# Patient Record
Sex: Male | Born: 1995
Health system: Southern US, Community
[De-identification: ages and names within clinical notes are randomized; demographics above are authoritative.]

## PROBLEM LIST (undated history)

## (undated) DIAGNOSIS — J45990 Exercise induced bronchospasm: Secondary | ICD-10-CM

## (undated) DIAGNOSIS — J302 Other seasonal allergic rhinitis: Secondary | ICD-10-CM

## (undated) DIAGNOSIS — T7840XA Allergy, unspecified, initial encounter: Secondary | ICD-10-CM

## (undated) HISTORY — PX: ADENOIDECTOMY: SHX5191

## (undated) HISTORY — DX: Allergy, unspecified, initial encounter: T78.40XA

## (undated) HISTORY — DX: Exercise induced bronchospasm: J45.990

## (undated) HISTORY — PX: MYRINGOTOMY: SUR874

---
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
## 2017-04-05 DIAGNOSIS — Z0000 Encounter for general adult medical examination without abnormal findings: Secondary | ICD-10-CM | POA: Diagnosis not present

## 2017-04-05 DIAGNOSIS — Z713 Dietary counseling and surveillance: Secondary | ICD-10-CM | POA: Diagnosis not present

## 2017-04-05 DIAGNOSIS — Z7189 Other specified counseling: Secondary | ICD-10-CM | POA: Diagnosis not present

## 2017-05-12 DIAGNOSIS — Z23 Encounter for immunization: Secondary | ICD-10-CM | POA: Diagnosis not present

## 2018-03-09 DIAGNOSIS — H7292 Unspecified perforation of tympanic membrane, left ear: Secondary | ICD-10-CM | POA: Diagnosis not present

## 2018-03-09 DIAGNOSIS — J302 Other seasonal allergic rhinitis: Secondary | ICD-10-CM | POA: Insufficient documentation

## 2018-03-09 DIAGNOSIS — H66002 Acute suppurative otitis media without spontaneous rupture of ear drum, left ear: Secondary | ICD-10-CM | POA: Diagnosis not present

## 2018-03-09 DIAGNOSIS — H6983 Other specified disorders of Eustachian tube, bilateral: Secondary | ICD-10-CM | POA: Diagnosis not present

## 2018-03-09 DIAGNOSIS — J069 Acute upper respiratory infection, unspecified: Secondary | ICD-10-CM | POA: Diagnosis not present

## 2018-04-07 DIAGNOSIS — Z0000 Encounter for general adult medical examination without abnormal findings: Secondary | ICD-10-CM | POA: Diagnosis not present

## 2018-04-07 DIAGNOSIS — Z713 Dietary counseling and surveillance: Secondary | ICD-10-CM | POA: Diagnosis not present

## 2018-04-07 DIAGNOSIS — Z68.41 Body mass index (BMI) pediatric, 5th percentile to less than 85th percentile for age: Secondary | ICD-10-CM | POA: Diagnosis not present

## 2018-04-29 DIAGNOSIS — Z0000 Encounter for general adult medical examination without abnormal findings: Secondary | ICD-10-CM | POA: Diagnosis not present

## 2018-04-29 DIAGNOSIS — H6983 Other specified disorders of Eustachian tube, bilateral: Secondary | ICD-10-CM | POA: Diagnosis not present

## 2018-04-29 DIAGNOSIS — H9012 Conductive hearing loss, unilateral, left ear, with unrestricted hearing on the contralateral side: Secondary | ICD-10-CM | POA: Diagnosis not present

## 2018-04-29 DIAGNOSIS — H7292 Unspecified perforation of tympanic membrane, left ear: Secondary | ICD-10-CM | POA: Diagnosis not present

## 2018-05-13 DIAGNOSIS — H6123 Impacted cerumen, bilateral: Secondary | ICD-10-CM | POA: Diagnosis not present

## 2018-05-13 DIAGNOSIS — H722X2 Other marginal perforations of tympanic membrane, left ear: Secondary | ICD-10-CM | POA: Diagnosis not present

## 2018-05-13 DIAGNOSIS — H6983 Other specified disorders of Eustachian tube, bilateral: Secondary | ICD-10-CM | POA: Diagnosis not present

## 2019-10-16 ENCOUNTER — Other Ambulatory Visit: Payer: Self-pay | Admitting: Otolaryngology

## 2019-10-16 NOTE — H&P (Signed)
HPI:   Christopher Pittman is a 24 y.o. male who presents as a new patient.  The patient presents in the company of his mother for new patient evaluation. He is a former patient of mine who underwent previous bilateral myringotomy and tube placement, last performed at age 67 with bilateral T tubes and adenoidectomy. Patient has been stable without significant otologic problems, his left tube extruded several years ago and concerns been raised regarding possible tympanic membrane perforation. Patient had minimal complaints until 4 days ago when he developed mild otalgia and otorrhea. Patient has severe seasonal allergies and is using Singulair and Zyrtec. He has chronic symptoms of eustachian tube dysfunction, sinus congestion and frequent headache.  PMH/Meds/All/SocHx/FamHx/ROS:   No past medical history on file.  No past surgical history on file.  No family history of bleeding disorders, wound healing problems or difficulty with anesthesia.   Social History   Social History  . Marital status: N/A  Spouse name: N/A  . Number of children: N/A  . Years of education: N/A   Occupational History  . Not on file.   Social History Main Topics  . Smoking status: Not on file  . Smokeless tobacco: Not on file  . Alcohol use Not on file  . Drug use: Unknown  . Sexual activity: Not on file   Other Topics Concern  . Not on file   Social History Narrative  . No narrative on file   Current Outpatient Prescriptions:  . ciprofloxacin-dexAMETHasone (CIPRODEX) 0.3-0.1 % otic suspension, Place 4 drops into the left ear nightly for 10 days., Disp: 7.5 mL, Rfl: 1 . fluticasone propionate (FLONASE) 50 mcg/actuation nasal spray, 2 sprays by Nasal route daily., Disp: 1 Inhaler, Rfl: 11  A complete ROS was performed with pertinent positives/negatives noted in the HPI. The remainder of the ROS are negative.   Physical Exam:   There were no vitals taken for this visit.  Constitutional:  Patient  appears well-nourished and well-developed. No acute distress.  Head/Face: Facial features are symmetric. Skull is normocephalic. Hair and scalp are normal. Normal temporal artery pulses. TMJ shows no joint deformity swelling or erythema.  Eyes: Pupils are equal, round and reactive to light. Conjunctiva and lids are normal. Normal extraocular mobility. Normal vision by patient report.  Ears:  Right T tympanostomy tube is in place and patent, no evidence of middle ear effusion or infection. Left ear shows posterior tympanic membrane perforation at the site of his prior tube. There is moderate erythema and some otorrhea. No evidence of cholesteatoma.  Nose/Sinus/Nasopharynx: Septum is normal. Normal nasal mucosa. Normal inferior turbinates. Normal middle and superior turbinates, sinus ostia patent without obstruction, mass or discharge. Nasopharynx patent.  Oral cavity/Oropharynx: Lips normal, teeth and gums normal with good dentition, normal oral vestibule. Normal floor of mouth, tongue and oral mucosa, no mucosal lesions, ulcer or mass, normal tongue mobility.  Hard and soft palate normal with normal mobility. One plus tonsils, no erythema or exudate. Base of tongue, retromolar trigone and oral pharynx normal. Normal sensation, mobility and gag.  Neck: No cervical lymphadenopathy, mass or swelling. Salivary glands normal to palpation without swelling, erythema or mass. Normal facial nerve function. Normal thyroid gland palpation.  Neurological: Alert and oriented to self, place and time. Normal reflexes and motor skills, balance and coordination.  Psychiatric: No unusual anxiety or evidence of depression. Appropriate affect.  Independent Review of Additional Tests or Records:  None  Procedures:  None  Impression & Plans:   The  patient presents for evaluation with a 4-day history of left otalgia. He has undergone previous bilateral knee ergotamine T-tube placement, last  performed almost 10 years ago. The left tympanostomy tube has extruded and the patient has a relatively large tympanic memory perforation with inflammation and findings consistent with acute infection. Patient prescribed Ciprodex drops in the left ear nightly x10 days. Avoid water exposure. He will continue with his allergy medications including saline, fluticasone, Singulair and Zyrtec. Follow-up in our office in 1 month for recheck with audiogram first.

## 2019-10-17 ENCOUNTER — Encounter (HOSPITAL_BASED_OUTPATIENT_CLINIC_OR_DEPARTMENT_OTHER): Payer: Self-pay | Admitting: Otolaryngology

## 2019-10-17 ENCOUNTER — Other Ambulatory Visit: Payer: Self-pay

## 2019-10-19 ENCOUNTER — Inpatient Hospital Stay: Admission: RE | Admit: 2019-10-19 | Payer: Self-pay | Source: Ambulatory Visit

## 2019-10-19 ENCOUNTER — Other Ambulatory Visit (HOSPITAL_COMMUNITY): Payer: Self-pay

## 2019-10-19 DIAGNOSIS — Z713 Dietary counseling and surveillance: Secondary | ICD-10-CM | POA: Diagnosis not present

## 2019-10-19 DIAGNOSIS — Z6822 Body mass index (BMI) 22.0-22.9, adult: Secondary | ICD-10-CM | POA: Diagnosis not present

## 2019-10-19 DIAGNOSIS — Z23 Encounter for immunization: Secondary | ICD-10-CM | POA: Diagnosis not present

## 2019-10-19 DIAGNOSIS — Z0000 Encounter for general adult medical examination without abnormal findings: Secondary | ICD-10-CM | POA: Diagnosis not present

## 2019-10-23 ENCOUNTER — Ambulatory Visit (HOSPITAL_BASED_OUTPATIENT_CLINIC_OR_DEPARTMENT_OTHER): Admission: RE | Admit: 2019-10-23 | Payer: 59 | Source: Home / Self Care | Admitting: Otolaryngology

## 2019-10-23 HISTORY — DX: Other seasonal allergic rhinitis: J30.2

## 2019-10-23 SURGERY — TYMPANOPLASTY
Anesthesia: General | Laterality: Left

## 2019-12-11 ENCOUNTER — Emergency Department (HOSPITAL_BASED_OUTPATIENT_CLINIC_OR_DEPARTMENT_OTHER)
Admission: EM | Admit: 2019-12-11 | Discharge: 2019-12-11 | Disposition: A | Discharge status: Home / Self Care | Payer: No Typology Code available for payment source | Attending: Emergency Medicine | Admitting: Emergency Medicine

## 2019-12-11 ENCOUNTER — Encounter (HOSPITAL_BASED_OUTPATIENT_CLINIC_OR_DEPARTMENT_OTHER): Payer: Self-pay

## 2019-12-11 ENCOUNTER — Other Ambulatory Visit: Payer: Self-pay

## 2019-12-11 DIAGNOSIS — W208XXA Other cause of strike by thrown, projected or falling object, initial encounter: Secondary | ICD-10-CM | POA: Insufficient documentation

## 2019-12-11 DIAGNOSIS — Y929 Unspecified place or not applicable: Secondary | ICD-10-CM | POA: Diagnosis not present

## 2019-12-11 DIAGNOSIS — T25612A Corrosion of second degree of left ankle, initial encounter: Secondary | ICD-10-CM | POA: Diagnosis not present

## 2019-12-11 DIAGNOSIS — T6591XA Toxic effect of unspecified substance, accidental (unintentional), initial encounter: Secondary | ICD-10-CM | POA: Diagnosis not present

## 2019-12-11 DIAGNOSIS — T25212A Burn of second degree of left ankle, initial encounter: Secondary | ICD-10-CM | POA: Diagnosis not present

## 2019-12-11 DIAGNOSIS — T24632A Corrosion of second degree of left lower leg, initial encounter: Secondary | ICD-10-CM

## 2019-12-11 DIAGNOSIS — Y990 Civilian activity done for income or pay: Secondary | ICD-10-CM | POA: Diagnosis not present

## 2019-12-11 DIAGNOSIS — Y9389 Activity, other specified: Secondary | ICD-10-CM | POA: Diagnosis not present

## 2019-12-11 NOTE — ED Notes (Signed)
EDP at bedside irrigating wound.

## 2019-12-11 NOTE — ED Triage Notes (Signed)
Pt arrived ambulatory to ED with c/o chemical burn to left inner ankle today. 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 States he continued to work and noticed it was burning when he got off, unknown substance. Area is blistered. Pt also has a bruise on inner foot near heal, unsure of how long it has been there.

## 2019-12-11 NOTE — ED Provider Notes (Signed)
[facility] EM[facility] DEPART[facility] Provider Note   CSN: [id number] Arrival date & time: 12/11/19  0900     History Chief Complaint  Patient presents with  . Foot Burn    Christopher Pittman is a 24 y.o. male otherwise healthy no daily medication use presents today for burn of his left posterior medial ankle.  He reports that 4-5 hours ago while at work he dropped a box onto his pants and feels that a unknown chemical had spilled out of the box.  He reports he did not have any immediate pain however 20 is 30 minutes prior to leaving work less than an hour ago he felt a slight burning sensation to the back of his leg looked at the area and saw the wound.  He describes a mild burning sensation constant worsened with palpation improved with rest nonradiating.  He spoke to his managers and they do not know what possible chemical this could have been.  He denies any fever/chills, numbness/tingling, weakness, pain with motion of the knee, ankle or foot or any additional concerns or injuries.  Of note triage note mentions a bruise to the inner foot near the heel, patient feels this is some dirt left over from his shoe today. Area nontender.  I took a Psychologist, sport and exercise with soap and water and washed the dirt away.  This does not appear to be a bruise/injury.  Of note patient reports Tdap was updated last month.   HPI     Past Medical History:  Diagnosis Date  . Seasonal allergies     There are no problems to display for this patient.   Past Surgical History:  Procedure Laterality Date  . ADENOIDECTOMY    . MYRINGOTOMY         History reviewed. No pertinent family history.  Social History   Tobacco Use  . Smoking status: Never Smoker  . Smokeless tobacco: Never Used  Substance Use Topics  . Alcohol use: Never  . Drug use: Never    Home Medications Prior to Admission medications   Medication Sig Start Date End Date Taking? Authorizing Provider  fluticasone (FLONASE) 50 MCG/ACT  nasal spray USE 1 SPRAY EACH NOSTRIL DAILY. 07/24/11   Marcha Solders, MD    Allergies    Patient has no known allergies.  Review of Systems   Review of Systems  Constitutional: Negative.  Negative for chills and fever.  Musculoskeletal: Negative for arthralgias, joint swelling and myalgias.  Skin: Positive for wound.  Neurological: Negative.  Negative for weakness and numbness.    Physical Exam Updated Vital Signs BP 121/85 (BP Location: Right Arm)   Pulse 70   Temp 99.1 F (37.3 C) (Oral)   Resp 18   Ht 5\' 8"  (1.727 m)   Wt 70.3 kg   SpO2 100%   BMI 23.57 kg/m   Physical Exam Constitutional:      General: He is not in acute distress.    Appearance: Normal appearance. He is well-developed. He is not ill-appearing or diaphoretic.  HENT:     Head: Normocephalic and atraumatic.     Right Ear: External ear normal.     Left Ear: External ear normal.     Nose: Nose normal.  Eyes:     General: Vision grossly intact. Gaze aligned appropriately.     Pupils: Pupils are equal, round, and reactive to light.  Neck:     Trachea: Trachea and phonation normal. No tracheal deviation.  Pulmonary:  Effort: Pulmonary effort is normal. No respiratory distress.  Abdominal:     General: There is no distension.     Palpations: Abdomen is soft.     Tenderness: There is no abdominal tenderness. There is no guarding or rebound.  Musculoskeletal:        General: Normal range of motion.     Cervical back: Normal range of motion.     Left knee: Normal.     Right lower leg: Normal.     Left lower leg: Laceration (Wound) present. No swelling or deformity. No edema.     Right ankle: Normal.     Right Achilles Tendon: Normal.     Left ankle: Normal.     Left Achilles Tendon: Normal.     Right foot: Normal.     Left foot: Normal.  Skin:    General: Skin is warm and dry.          Comments: Superficial abrasion with crusting and small blisters present to the left posterior medial  ankle as pictured below.  Neurological:     Mental Status: He is alert.     GCS: GCS eye subscore is 4. GCS verbal subscore is 5. GCS motor subscore is 6.     Comments: Speech is clear and goal oriented, follows commands Major Cranial nerves without deficit, no facial droop Normal strength in lower extremities bilaterally including dorsiflexion and plantar flexion Sensation normal to light and sharp touch Moves extremities without ataxia, coordination intact  Psychiatric:        Behavior: Behavior normal.       ED Results / Procedures / Treatments   Labs (all labs ordered are listed, but only abnormal results are displayed) Labs Reviewed - No data to display  EKG None  Radiology No results found.  Procedures .Burn Treatment  Date/Time: 12/11/2019 10:27 AM Performed by: Bill Salinas, PA-C Authorized by: Bill Salinas, PA-C   Consent:    Consent obtained:  Verbal   Consent given by:  Patient   Risks discussed:  Bleeding and pain Procedure details:    Total body burn percentage - superficial :  1   Total body burn percentage - partial/full:  1 Burn area 1 details:    Burn depth:  Partial thickness (2nd)   Affected area:  Lower extremity   Lower extremity location:  L leg   Debridement performed: yes     Debridement mechanism:  Gauze   Indications for debridement: adherent debris, devitalized skin and ruptured blisters     Wound base:  Pink   Wound treatment:  Petroleum jelly, antiseptic skin cleanser and saline wash   Dressing:  Xeroform gauze Post-procedure details:    Patient tolerance of procedure:  Tolerated well, no immediate complications   (including critical care time)  Medications Ordered in ED Medications - No data to display  ED Course  I have reviewed the triage vital signs and the nursing notes.  Pertinent labs & imaging results that were available during my care of the patient were reviewed by me and considered in my medical decision  making (see chart for details).  Clinical Course as of Dec 10 1041  Mon Dec 11, 2019  0958 Clean, Xeroform   [BM]    Clinical Course User Index [BM] Elizabeth Palau   MDM Rules/Calculators/A&P                     24 year old male presents today for a  chemical burn of the left lower leg that occurred this morning.  He spilled a small small amount of chemical on his leg that spilled out of a box at work.  He went several hours working with the wet pants/sock before he noticed a burning sensation to his leg and noticed the wound.  He denies any other injuries today.  The questionable bruise on his left midfoot appears to be dirt that was wiped off.  Tdap up-to-date per patient.  I personally cleaned burn as above, unfortunately the chemical is unknown at this point.  Xeroform dressing was applied.  Patient tolerated procedure well.  Patient was given small amount of supplies for continued wound care at home.  No indication for antibiotics at this point.  He will follow-up with his primary care provider for wound recheck and return to the ER if he develops signs of infection or any worsening/new symptoms.  At this time there does not appear to be any evidence of an acute emergency medical condition and the patient appears stable for discharge with appropriate outpatient follow up. Diagnosis was discussed with patient who verbalizes understanding of care plan and is agreeable to discharge. I have discussed return precautions with patient who verbalizes understanding of return precautions. Patient encouraged to follow-up with their PCP. All questions answered.  Patient was seen and evaluated by Dr. Pilar Plate during this visit who agrees with management and discharge.  Note: Portions of this report may have been transcribed using voice recognition software. Every effort was made to ensure accuracy; however, inadvertent computerized transcription errors may still be present. Final Clinical  Impression(s) / ED Diagnoses Final diagnoses:  Partial thickness chemical burn of left lower leg, initial encounter    Rx / DC Orders ED Discharge Orders    None       Tyrees, Chopin 12/11/19 1043    Sabas Sous, MD 12/12/19 [phone]

## 2019-12-11 NOTE — Discharge Instructions (Addendum)
You have been diagnosed today with Chemical Burn of Left Lower Leg.  At this time there does not appear to be the presence of an emergent medical condition, however there is always the potential for conditions to change. Please read and follow the below instructions.  Please return to the Emergency Department immediately for any new or worsening symptoms. Please be sure to follow up with your Primary Care Provider this week for recheck of your wound. Please continue wound care at home.  You may rinse the area gently with clean water, keep bandaged with sterile gauze and the petroleum gauze given to you today.  If you develop any signs of infection return to the ER immediately.  Get help right away if: You develop any signs of infection, such as: Red streaks near the burn. Fluid, blood, or pus coming from the burn. A bad smell coming from your burn. You have very bad swelling. You have very bad pain. You have a fever. You lose feeling (have numbness) or have tingling in the burned area or farther down your legs or arms. You have trouble breathing, or you are coughing or breathing loudly (wheezing). You have chest pain. You have any new/concerning or worsening of symptoms  Please read the additional information packets attached to your discharge summary.  Do not take your medicine if  develop an itchy rash, swelling in your mouth or lips, or difficulty breathing; call 911 and seek immediate emergency medical attention if this occurs.  Note: Portions of this text may have been transcribed using voice recognition software. Every effort was made to ensure accuracy; however, inadvertent computerized transcription errors may still be present.

## 2019-12-14 ENCOUNTER — Encounter (HOSPITAL_BASED_OUTPATIENT_CLINIC_OR_DEPARTMENT_OTHER): Payer: Self-pay | Admitting: *Deleted

## 2019-12-14 ENCOUNTER — Emergency Department (HOSPITAL_BASED_OUTPATIENT_CLINIC_OR_DEPARTMENT_OTHER)
Admission: EM | Admit: 2019-12-14 | Discharge: 2019-12-14 | Disposition: A | Discharge status: Home / Self Care | Payer: No Typology Code available for payment source | Attending: Emergency Medicine | Admitting: Emergency Medicine

## 2019-12-14 ENCOUNTER — Other Ambulatory Visit: Payer: Self-pay

## 2019-12-14 DIAGNOSIS — Y9289 Other specified places as the place of occurrence of the external cause: Secondary | ICD-10-CM | POA: Diagnosis not present

## 2019-12-14 DIAGNOSIS — X58XXXD Exposure to other specified factors, subsequent encounter: Secondary | ICD-10-CM | POA: Diagnosis not present

## 2019-12-14 DIAGNOSIS — Z5189 Encounter for other specified aftercare: Secondary | ICD-10-CM

## 2019-12-14 DIAGNOSIS — T550X1D Toxic effect of soaps, accidental (unintentional), subsequent encounter: Secondary | ICD-10-CM | POA: Diagnosis not present

## 2019-12-14 DIAGNOSIS — Z79899 Other long term (current) drug therapy: Secondary | ICD-10-CM | POA: Insufficient documentation

## 2019-12-14 DIAGNOSIS — T24432D Corrosion of unspecified degree of left lower leg, subsequent encounter: Secondary | ICD-10-CM | POA: Diagnosis not present

## 2019-12-14 DIAGNOSIS — Z4800 Encounter for change or removal of nonsurgical wound dressing: Secondary | ICD-10-CM | POA: Diagnosis present

## 2019-12-14 MED ORDER — DOXYCYCLINE HYCLATE 100 MG PO CAPS: 100.0000 mg | ORAL_CAPSULE | Freq: Two times a day (BID) | ORAL | 0 refills | Status: DC

## 2019-12-14 MED ORDER — BACITRACIN ZINC 500 UNIT/GM EX OINT
TOPICAL_OINTMENT | Freq: Two times a day (BID) | CUTANEOUS | Status: DC
  Filled 2019-12-14: qty 28.35

## 2019-12-14 MED ORDER — KETOROLAC TROMETHAMINE 15 MG/ML IJ SOLN
15.0000 mg | Freq: Once | INTRAMUSCULAR | Status: AC
  Administered 2019-12-14: 15 mg via INTRAMUSCULAR
  Filled 2019-12-14: qty 1

## 2019-12-14 MED FILL — DOXYCYCLINE HYCLATE 100 MG: 100 | 7 days supply | Qty: 14 | Fill #0

## 2019-12-14 NOTE — ED Notes (Signed)
Returns for wound recheck at Left ankle area, states rec injury from spilled chemical for cleaning. States he has kept dressing on as instructed and dry. States has had some drainage from wound. Appears light brown in color, states Monday and Tuesday drainage was noted from dressing into sock and pants. Noted to have some issues placing weight on injured ankle

## 2019-12-14 NOTE — ED Triage Notes (Signed)
Wound check left ankle. Chemical burn 3 days ago.

## 2019-12-14 NOTE — ED Provider Notes (Signed)
[facility] DEPART[facility] Provider Note   CSN: [id number] Arrival date & time: 12/14/19  1109     History Chief Complaint  Patient presents with  . Wound Check    Christopher Pittman is a 24 y.o. male.  HPI 24 year old African-American male with no pertinent past medical history presents to the emergency department today for evaluation of wound recheck.  Patient reports that on 12/11/2019 he sustained a chemical burn to his left lower leg.  This was while he was at work.  Patient is unsure of the chemical.  He states that it was some sort of cleaning agent.  Denies feeling like Clorox.  Patient states that he was seen in the ER that day.  For some burning sensation to the area.  Have the area debrided.  Patient has been trying to keep it clean at home.  He is wearing his socks.  Patient reports that he is had some drainage from the area.  This is brown in nature.  Patient denies any fevers or chills.  He does report some pain with ambulation on the foot.  Patient denies any swelling.  No nausea or vomiting.  No history of MRSA.  Tetanus was up-to-date.  Patient was initially not placed on antibiotics.    Past Medical History:  Diagnosis Date  . Seasonal allergies     There are no problems to display for this patient.   Past Surgical History:  Procedure Laterality Date  . ADENOIDECTOMY    . MYRINGOTOMY         No family history on file.  Social History   Tobacco Use  . Smoking status: Never Smoker  . Smokeless tobacco: Never Used  Substance Use Topics  . Alcohol use: Never  . Drug use: Never    Home Medications Prior to Admission medications   Medication Sig Start Date End Date Taking? Authorizing Provider  fluticasone (FLONASE) 50 MCG/ACT nasal spray USE 1 SPRAY EACH NOSTRIL DAILY. 07/24/11  Yes Ramgoolam, Donnie Aho, MD  doxycycline (VIBRAMYCIN) 100 MG capsule Take 1 capsule (100 mg total) by mouth 2 (two) times daily. 12/14/19   Doristine Devoid, PA-C     Allergies    Patient has no known allergies.  Review of Systems   Review of Systems Constitutional: Negative.  Negative for chills and fever.  Musculoskeletal: Negative for arthralgias, joint swelling and myalgias.  Skin: Positive for wound.  Neurological: Negative.  Negative for weakness and numbness.   Physical Exam Updated Vital Signs BP 111/74   Pulse 73   Temp 98.3 F (36.8 C) (Oral)   Resp 20   Ht 5\' 8"  (1.727 m)   Wt 70.3 kg   SpO2 100%   BMI 23.57 kg/m   Physical Exam Vitals and nursing note reviewed.  Constitutional:      General: He is not in acute distress.    Appearance: He is well-developed.  HENT:     Head: Normocephalic and atraumatic.  Eyes:     General: No scleral icterus.       Right eye: No discharge.        Left eye: No discharge.  Cardiovascular:     Pulses: Normal pulses.  Pulmonary:     Effort: No respiratory distress.  Musculoskeletal:        General: Normal range of motion.     Cervical back: Normal range of motion.     Comments: Full range of motion to left ankle.  DP pulse are  2+.  Sensation intact.  Skin:    General: Skin is warm and dry.     Capillary Refill: Capillary refill takes less than 2 seconds.     Coloration: Skin is not pale.     Comments: Patient with healing wound to the left posterior lower leg.  There is minimal erythema.  Wound is not warm to touch.  There is no fluctuance.  No appreciable drainage to the area.  Does like some granulation tissue with ulceration appreciated.  Skin compartments are soft.  No induration or fluctuance appreciated.  Neurological:     Mental Status: He is alert.     Comments: Strength and sensation intact.  Ambulates with normal gait.  Psychiatric:        Behavior: Behavior normal.        Thought Content: Thought content normal.        Judgment: Judgment normal.     ED Results / Procedures / Treatments   Labs (all labs ordered are listed, but only abnormal results are displayed) Labs  Reviewed - No data to display  EKG None  Radiology No results found.  Procedures Procedures (including critical care time)  Medications Ordered in ED Medications  ketorolac (TORADOL) 15 MG/ML injection 15 mg (has no administration in time range)  bacitracin ointment (has no administration in time range)    ED Course  I have reviewed the triage vital signs and the nursing notes.  Pertinent labs & imaging results that were available during my care of the patient were reviewed by me and considered in my medical decision making (see chart for details).   Initially not started on antibiotics.24 year old male presents the ER for wound recheck.  Patient had chemical burn 3 days ago. Reports some drainage to the area with increased pain with ambulation.  No systemic signs of infection.  Wound on the examination does appear to be healing. Granulation tissue noted. FROM of left ankle. Achilles tendon in tact. Doubt septic joint. Minimal erythema. Given drainage however will place on doxy. Dicussed wound care. Pt has taken no medication for pain. Encouraged NSAIDS and tylenol. Pt will need wound recheck in 2 days.   Pt is hemodynamically stable, in NAD, & able to ambulate in the ED. Evaluation does not show pathology that would require ongoing emergent intervention or inpatient treatment. I explained the diagnosis to the patient. Pain has been managed & has no complaints prior to dc. Pt is comfortable with above plan and is stable for discharge at this time. All questions were answered prior to disposition. Strict return precautions for f/u to the ED were discussed. Encouraged follow up with PCP.     MDM Rules/Calculators/A&P                       Final Clinical Impression(s) / ED Diagnoses Final diagnoses:  Visit for wound check    Rx / DC Orders ED Discharge Orders         Ordered    doxycycline (VIBRAMYCIN) 100 MG capsule  2 times daily     12/14/19 1151           Wallace Keller 12/14/19 1158    Little, Ambrose Finland, MD 12/14/19 1253

## 2019-12-14 NOTE — Discharge Instructions (Addendum)
You have been seen today in the Emergency Department for cellulitis, a superficial skin infection. Please take your antibiotics as prescribed for their ENTIRE prescribed duration.   May alternate between motrin and tylenol for fever, pain, and swelling.  Please follow up with your doctor or return to the ER in 2 days for recheck of your infection if you are not improving.  Call your doctor sooner or return to the ER if you develop worsening signs of infection such as: increased redness, increased pain, pus, fever, or other symptoms that concern you.  

## 2020-02-09 DIAGNOSIS — J309 Allergic rhinitis, unspecified: Secondary | ICD-10-CM | POA: Diagnosis not present

## 2020-02-09 DIAGNOSIS — H7392 Unspecified disorder of tympanic membrane, left ear: Secondary | ICD-10-CM | POA: Diagnosis not present

## 2020-02-09 DIAGNOSIS — B9729 Other coronavirus as the cause of diseases classified elsewhere: Secondary | ICD-10-CM | POA: Diagnosis not present

## 2020-02-09 DIAGNOSIS — J029 Acute pharyngitis, unspecified: Secondary | ICD-10-CM | POA: Diagnosis not present

## 2020-03-26 DIAGNOSIS — H7292 Unspecified perforation of tympanic membrane, left ear: Secondary | ICD-10-CM

## 2020-03-26 DIAGNOSIS — H9012 Conductive hearing loss, unilateral, left ear, with unrestricted hearing on the contralateral side: Secondary | ICD-10-CM | POA: Diagnosis not present

## 2020-03-26 HISTORY — DX: Unspecified perforation of tympanic membrane, left ear: H72.92

## 2020-04-01 ENCOUNTER — Encounter (HOSPITAL_BASED_OUTPATIENT_CLINIC_OR_DEPARTMENT_OTHER): Payer: Self-pay | Admitting: Otolaryngology

## 2020-04-01 ENCOUNTER — Other Ambulatory Visit: Payer: Self-pay

## 2020-04-04 ENCOUNTER — Other Ambulatory Visit (HOSPITAL_COMMUNITY): Payer: BC Managed Care – PPO

## 2020-04-05 ENCOUNTER — Other Ambulatory Visit (HOSPITAL_COMMUNITY)
Admission: RE | Admit: 2020-04-05 | Discharge: 2020-04-05 | Disposition: A | Discharge status: Home / Self Care | Payer: BC Managed Care – PPO | Source: Ambulatory Visit | Attending: Otolaryngology | Admitting: Otolaryngology

## 2020-04-05 DIAGNOSIS — Z20822 Contact with and (suspected) exposure to covid-19: Secondary | ICD-10-CM | POA: Insufficient documentation

## 2020-04-05 DIAGNOSIS — Z01812 Encounter for preprocedural laboratory examination: Secondary | ICD-10-CM | POA: Insufficient documentation

## 2020-04-05 LAB — SARS CORONAVIRUS 2 (TAT 6-24 HRS): SARS Coronavirus 2: NEGATIVE

## 2020-04-06 NOTE — H&P (Signed)
  HPI:   Christopher Pittman is a 25 y.o. male who presents as a return Patient.   Current problem: Ears.  HPI: Return visit. He never did follow-up for tympanoplasty last year. He was told that his right ear may not have a perforation anymore. He feels that his hearing is better on the right. No other new complaints.  PMH/Meds/All/SocHx/FamHx/ROS:   Past Medical History:  Diagnosis Date  . Allergy   Past Surgical History:  Procedure Laterality Date  . ADENOIDECTOMY  . MYRINGOTOMY W/ TUBES  . TYMPANOSTOMY TUBE PLACEMENT   No family history of bleeding disorders, wound healing problems or difficulty with anesthesia.   Social History   Socioeconomic History  . Marital status: Single  Spouse name: Not on file  . Number of children: Not on file  . Years of education: Not on file  . Highest education level: Not on file  Occupational History  . Not on file  Tobacco Use  . Smoking status: Never Smoker  . Smokeless tobacco: Never Used  Substance and Sexual Activity  . Alcohol use: No  . Drug use: Not on file  . Sexual activity: Not on file  Other Topics Concern  . Not on file  Social History Narrative  . Not on file   Social Determinants of Health   Financial Resource Strain:  . Difficulty of Paying Living Expenses:  Food Insecurity:  . Worried About Programme researcher, broadcasting/film/video in the Last Year:  . Barista in the Last Year:  Transportation Needs:  . Freight forwarder (Medical):  Marland Kitchen Lack of Transportation (Non-Medical):  Physical Activity:  . Days of Exercise per Week:  . Minutes of Exercise per Session:  Stress:  . Feeling of Stress :  Social Connections:  . Frequency of Communication with Friends and Family:  . Frequency of Social Gatherings with Friends and Family:  . Attends Religious Services:  . Active Member of Clubs or Organizations:  . Attends Banker Meetings:  Marland Kitchen Marital Status:   Current Outpatient Medications:  . cetirizine HCl (ZYRTEC  ORAL), Take by mouth., Disp: , Rfl:  . montelukast (SINGULAIR) 10 mg tablet, TK 1 T PO QD, Disp: , Rfl: 0 . fluticasone propionate (FLONASE) 50 mcg/actuation nasal spray, 2 sprays by Nasal route daily., Disp: 1 Inhaler, Rfl: 11   Physical Exam:   Healthy-appearing young man in no distress. Breathing and voice are clear and healthy. Ear canals are clear. There is a clean dry central perforation on the left. Middle ear looks healthy. On the right side there is a small central monomer, possibly residual perforation but it is hard to say for sure.   Independent Review of Additional Tests or Records:  none  Procedures:  none  Impression & Plans:  Residual perforation on the left, possibly healed on the right. Recommend audiometric evaluation and then we will discuss possible intervention.

## 2020-04-08 ENCOUNTER — Ambulatory Visit (HOSPITAL_BASED_OUTPATIENT_CLINIC_OR_DEPARTMENT_OTHER)
Admission: RE | Admit: 2020-04-08 | Discharge: 2020-04-08 | Disposition: A | Discharge status: Home / Self Care | Payer: BC Managed Care – PPO | Attending: Otolaryngology | Admitting: Otolaryngology

## 2020-04-08 ENCOUNTER — Other Ambulatory Visit: Payer: Self-pay

## 2020-04-08 ENCOUNTER — Ambulatory Visit (HOSPITAL_BASED_OUTPATIENT_CLINIC_OR_DEPARTMENT_OTHER): Payer: BC Managed Care – PPO | Admitting: Anesthesiology

## 2020-04-08 ENCOUNTER — Encounter (HOSPITAL_BASED_OUTPATIENT_CLINIC_OR_DEPARTMENT_OTHER): Payer: Self-pay | Admitting: Otolaryngology

## 2020-04-08 ENCOUNTER — Encounter (HOSPITAL_BASED_OUTPATIENT_CLINIC_OR_DEPARTMENT_OTHER)
Admission: RE | Disposition: A | Discharge status: Home / Self Care | Payer: Self-pay | Source: Home / Self Care | Attending: Otolaryngology

## 2020-04-08 DIAGNOSIS — H7292 Unspecified perforation of tympanic membrane, left ear: Secondary | ICD-10-CM | POA: Diagnosis not present

## 2020-04-08 DIAGNOSIS — H6983 Other specified disorders of Eustachian tube, bilateral: Secondary | ICD-10-CM | POA: Diagnosis not present

## 2020-04-08 HISTORY — PX: TYMPANOPLASTY: SHX33

## 2020-04-08 SURGERY — TYMPANOPLASTY
Anesthesia: General | Site: Ear | Laterality: Left

## 2020-04-08 MED ORDER — LIDOCAINE 2% (20 MG/ML) 5 ML SYRINGE
INTRAMUSCULAR | Status: DC | PRN
  Administered 2020-04-08: 60 mg via INTRAVENOUS

## 2020-04-08 MED ORDER — BACITRACIN ZINC 500 UNIT/GM EX OINT
TOPICAL_OINTMENT | CUTANEOUS | Status: DC | PRN
  Administered 2020-04-08: 1 via TOPICAL

## 2020-04-08 MED ORDER — OXYCODONE HCL 5 MG/5ML PO SOLN: 5.0000 mg | Freq: Once | ORAL | Status: DC | PRN

## 2020-04-08 MED ORDER — MEPERIDINE HCL 25 MG/ML IJ SOLN: 6.2500 mg | INTRAMUSCULAR | Status: DC | PRN

## 2020-04-08 MED ORDER — HYDROCODONE-ACETAMINOPHEN 7.5-325 MG PO TABS: 1.0000 | ORAL_TABLET | Freq: Four times a day (QID) | ORAL | 0 refills | Status: DC | PRN

## 2020-04-08 MED ORDER — ONDANSETRON HCL 4 MG/2ML IJ SOLN
INTRAMUSCULAR | Status: DC | PRN
  Administered 2020-04-08: 4 mg via INTRAVENOUS

## 2020-04-08 MED ORDER — CIPROFLOXACIN-DEXAMETHASONE 0.3-0.1 % OT SUSP
OTIC | Status: DC | PRN
  Administered 2020-04-08: 4 [drp] via OTIC

## 2020-04-08 MED ORDER — BACITRACIN ZINC 500 UNIT/GM EX OINT
TOPICAL_OINTMENT | CUTANEOUS | Status: AC
  Filled 2020-04-08: qty 28.35

## 2020-04-08 MED ORDER — MIDAZOLAM HCL 2 MG/2ML IJ SOLN
INTRAMUSCULAR | Status: AC
  Filled 2020-04-08: qty 2

## 2020-04-08 MED ORDER — BUPIVACAINE HCL (PF) 0.25 % IJ SOLN
INTRAMUSCULAR | Status: AC
  Filled 2020-04-08: qty 30

## 2020-04-08 MED ORDER — LIDOCAINE-EPINEPHRINE 1 %-1:100000 IJ SOLN
INTRAMUSCULAR | Status: DC | PRN
  Administered 2020-04-08: 6 mL

## 2020-04-08 MED ORDER — OXYCODONE HCL 5 MG PO TABS: 5.0000 mg | ORAL_TABLET | Freq: Once | ORAL | Status: DC | PRN

## 2020-04-08 MED ORDER — EPINEPHRINE PF 1 MG/ML IJ SOLN
INTRAMUSCULAR | Status: AC
  Filled 2020-04-08: qty 1

## 2020-04-08 MED ORDER — LACTATED RINGERS IV SOLN: INTRAVENOUS | Status: DC

## 2020-04-08 MED ORDER — CIPROFLOXACIN-DEXAMETHASONE 0.3-0.1 % OT SUSP
3.0000 [drp] | Freq: Three times a day (TID) | OTIC | 2 refills | Status: DC
Start: 2020-04-08 — End: 2021-07-14

## 2020-04-08 MED ORDER — EPHEDRINE SULFATE 50 MG/ML IJ SOLN
INTRAMUSCULAR | Status: DC | PRN
  Administered 2020-04-08: 5 mg via INTRAVENOUS

## 2020-04-08 MED ORDER — DEXAMETHASONE SODIUM PHOSPHATE 10 MG/ML IJ SOLN
INTRAMUSCULAR | Status: AC
  Filled 2020-04-08: qty 1

## 2020-04-08 MED ORDER — PROMETHAZINE HCL 25 MG/ML IJ SOLN: 6.2500 mg | INTRAMUSCULAR | Status: DC | PRN

## 2020-04-08 MED ORDER — LIDOCAINE 2% (20 MG/ML) 5 ML SYRINGE
INTRAMUSCULAR | Status: AC
  Filled 2020-04-08: qty 5

## 2020-04-08 MED ORDER — DEXAMETHASONE SODIUM PHOSPHATE 10 MG/ML IJ SOLN
INTRAMUSCULAR | Status: DC | PRN
  Administered 2020-04-08: 10 mg via INTRAVENOUS

## 2020-04-08 MED ORDER — HYDROMORPHONE HCL 1 MG/ML IJ SOLN: 0.2500 mg | INTRAMUSCULAR | Status: DC | PRN

## 2020-04-08 MED ORDER — ONDANSETRON HCL 4 MG/2ML IJ SOLN
INTRAMUSCULAR | Status: AC
  Filled 2020-04-08: qty 2

## 2020-04-08 MED ORDER — CIPROFLOXACIN-DEXAMETHASONE 0.3-0.1 % OT SUSP
OTIC | Status: AC
  Filled 2020-04-08: qty 7.5

## 2020-04-08 MED ORDER — PROPOFOL 10 MG/ML IV BOLUS
INTRAVENOUS | Status: DC | PRN
  Administered 2020-04-08: 200 mg via INTRAVENOUS

## 2020-04-08 MED ORDER — LIDOCAINE-EPINEPHRINE 1 %-1:100000 IJ SOLN
INTRAMUSCULAR | Status: AC
  Filled 2020-04-08: qty 3

## 2020-04-08 MED ORDER — FENTANYL CITRATE (PF) 100 MCG/2ML IJ SOLN
INTRAMUSCULAR | Status: AC
  Filled 2020-04-08: qty 2

## 2020-04-08 MED ORDER — AMISULPRIDE (ANTIEMETIC) 5 MG/2ML IV SOLN: 10.0000 mg | Freq: Once | INTRAVENOUS | Status: DC | PRN

## 2020-04-08 MED ORDER — MIDAZOLAM HCL 5 MG/5ML IJ SOLN
INTRAMUSCULAR | Status: DC | PRN
  Administered 2020-04-08: 2 mg via INTRAVENOUS

## 2020-04-08 MED ORDER — METHYLENE BLUE 0.5 % INJ SOLN
INTRAVENOUS | Status: AC
  Filled 2020-04-08: qty 10

## 2020-04-08 MED ORDER — PROMETHAZINE HCL 25 MG RE SUPP
25.0000 mg | Freq: Four times a day (QID) | RECTAL | 1 refills | Status: DC | PRN
Start: 2020-04-08 — End: 2021-07-14

## 2020-04-08 MED ORDER — PROPOFOL 500 MG/50ML IV EMUL
INTRAVENOUS | Status: AC
  Filled 2020-04-08: qty 50

## 2020-04-08 MED ORDER — FENTANYL CITRATE (PF) 100 MCG/2ML IJ SOLN
INTRAMUSCULAR | Status: DC | PRN
  Administered 2020-04-08: 50 ug via INTRAVENOUS

## 2020-04-08 SURGICAL SUPPLY — 63 items
BENZOIN TINCTURE PRP APPL 2/3 (GAUZE/BANDAGES/DRESSINGS) IMPLANT
BLADE CLIPPER SURG (BLADE) IMPLANT
BLADE NEEDLE 3 SS STRL (BLADE) IMPLANT
BNDG CONFORM 3 STRL LF (GAUZE/BANDAGES/DRESSINGS) IMPLANT
BNDG GAUZE ELAST 4 BULKY (GAUZE/BANDAGES/DRESSINGS) IMPLANT
CANISTER SUCT 1200ML W/VALVE (MISCELLANEOUS) ×2 IMPLANT
CLEANER CAUTERY TIP 5X5 PAD (MISCELLANEOUS) ×1 IMPLANT
COTTONBALL LRG STERILE PKG (GAUZE/BANDAGES/DRESSINGS) ×2 IMPLANT
COVER WAND RF STERILE (DRAPES) IMPLANT
DECANTER SPIKE VIAL GLASS SM (MISCELLANEOUS) IMPLANT
DERMABOND ADVANCED (GAUZE/BANDAGES/DRESSINGS) ×1
DERMABOND ADVANCED .7 DNX12 (GAUZE/BANDAGES/DRESSINGS) ×1 IMPLANT
DRAPE EENT ADH APERT 31X51 STR (DRAPES) IMPLANT
DRAPE INCISE 23X17 IOBAN STRL (DRAPES)
DRAPE INCISE IOBAN 23X17 STRL (DRAPES) IMPLANT
DRAPE MICROSCOPE URBAN (DRAPES) ×2 IMPLANT
DRAPE MICROSCOPE WILD 40.5X102 (DRAPES) IMPLANT
DROPPER MEDICINE STER 1.5ML LF (MISCELLANEOUS) IMPLANT
DRSG GLASSCOCK MASTOID ADT (GAUZE/BANDAGES/DRESSINGS) ×2 IMPLANT
DRSG GLASSCOCK MASTOID PED (GAUZE/BANDAGES/DRESSINGS) IMPLANT
DRSG TELFA 3X8 NADH (GAUZE/BANDAGES/DRESSINGS) IMPLANT
ELECT COATED BLADE 2.86 ST (ELECTRODE) ×2 IMPLANT
ELECT REM PT RETURN 9FT ADLT (ELECTROSURGICAL) ×2
ELECTRODE REM PT RTRN 9FT ADLT (ELECTROSURGICAL) ×1 IMPLANT
GAUZE 4X4 16PLY RFD (DISPOSABLE) IMPLANT
GAUZE SPONGE 4X4 12PLY STRL (GAUZE/BANDAGES/DRESSINGS) IMPLANT
GAUZE SPONGE 4X4 12PLY STRL LF (GAUZE/BANDAGES/DRESSINGS) IMPLANT
GLOVE BIOGEL M 6.5 STRL (GLOVE) ×2 IMPLANT
GLOVE BIOGEL PI IND STRL 6.5 (GLOVE) ×1 IMPLANT
GLOVE BIOGEL PI INDICATOR 6.5 (GLOVE) ×1
GLOVE ECLIPSE 6.5 STRL STRAW (GLOVE) ×2 IMPLANT
GLOVE ECLIPSE 7.5 STRL STRAW (GLOVE) ×2 IMPLANT
GOWN STRL REUS W/ TWL LRG LVL3 (GOWN DISPOSABLE) ×2 IMPLANT
GOWN STRL REUS W/ TWL XL LVL3 (GOWN DISPOSABLE) ×1 IMPLANT
GOWN STRL REUS W/TWL LRG LVL3 (GOWN DISPOSABLE) ×4
GOWN STRL REUS W/TWL XL LVL3 (GOWN DISPOSABLE) ×2
IV CATH AUTO 14GX1.75 SAFE ORG (IV SOLUTION) IMPLANT
IV SET EXT 30 76VOL 4 MALE LL (IV SETS) ×2 IMPLANT
NDL SAFETY ECLIPSE 18X1.5 (NEEDLE) ×1 IMPLANT
NEEDLE HYPO 18GX1.5 SHARP (NEEDLE) ×2
NEEDLE PRECISIONGLIDE 27X1.5 (NEEDLE) ×2 IMPLANT
NS IRRIG 1000ML POUR BTL (IV SOLUTION) ×2 IMPLANT
PACK ENT DAY SURGERY (CUSTOM PROCEDURE TRAY) ×2 IMPLANT
PAD CLEANER CAUTERY TIP 5X5 (MISCELLANEOUS) ×1
PENCIL FOOT CONTROL (ELECTRODE) ×2 IMPLANT
SET BASIN DAY SURGERY F.S. (CUSTOM PROCEDURE TRAY) ×2 IMPLANT
SHEET MEDIUM DRAPE 40X70 STRL (DRAPES) IMPLANT
SLEEVE SCD COMPRESS KNEE MED (MISCELLANEOUS) ×2 IMPLANT
SPONGE SURGIFOAM ABS GEL 12-7 (HEMOSTASIS) ×2 IMPLANT
STRIP CLOSURE SKIN 1/2X4 (GAUZE/BANDAGES/DRESSINGS) IMPLANT
SUT CHROMIC 3 0 PS 2 (SUTURE) ×2 IMPLANT
SUT CHROMIC 4 0 P 3 18 (SUTURE) IMPLANT
SUT CHROMIC 4 0 PS 2 18 (SUTURE) IMPLANT
SUT ETHILON 5 0 P 3 18 (SUTURE)
SUT NYLON ETHILON 5-0 P-3 1X18 (SUTURE) IMPLANT
SUT PLAIN 5 0 P 3 18 (SUTURE) IMPLANT
SUT VIC AB 3-0 FS2 27 (SUTURE) IMPLANT
SYR 5ML LL (SYRINGE) IMPLANT
SYR BULB EAR ULCER 3OZ GRN STR (SYRINGE) ×2 IMPLANT
SYR CONTROL 10ML LL (SYRINGE) ×2 IMPLANT
TOWEL GREEN STERILE FF (TOWEL DISPOSABLE) ×2 IMPLANT
TRAY DSU PREP LF (CUSTOM PROCEDURE TRAY) ×2 IMPLANT
TUBING IRRIGATION (MISCELLANEOUS) IMPLANT

## 2020-04-08 NOTE — Interval H&P Note (Signed)
History and Physical Interval Note:  04/08/2020 7:15 AM  Christopher Pittman  has presented today for surgery, with the diagnosis of tympanic membrane rupture.  The various methods of treatment have been discussed with the patient and family. After consideration of risks, benefits and other options for treatment, the patient has consented to  Procedure(s): TYMPANOPLASTY (Left) as a surgical intervention.  The patient's history has been reviewed, patient examined, no change in status, stable for surgery.  I have reviewed the patient's chart and labs.  Questions were answered to the patient's satisfaction.     [name]

## 2020-04-08 NOTE — Transfer of Care (Signed)
Immediate Anesthesia Transfer of Care Note  Patient: Christopher Pittman  Procedure(s) Performed: LEFT TYMPANOPLASTY (Left Ear)  Patient Location: PACU  Anesthesia Type:General  Level of Consciousness: drowsy and patient cooperative  Airway & Oxygen Therapy: Patient Spontanous Breathing and Patient connected to face mask oxygen  Post-op Assessment: Report given to RN and Post -op Vital signs reviewed and stable  Post vital signs: Reviewed and stable  Last Vitals:  Vitals Value Taken Time  BP 86/47 04/08/20 0840  Temp    Pulse 67 04/08/20 0841  Resp 10 04/08/20 0841  SpO2 100 % 04/08/20 0841  Vitals shown include unvalidated device data.  Last Pain:  Vitals:   04/08/20 0619  TempSrc: Oral  PainSc: 0-No pain      Patients Stated Pain Goal: 0 (04/08/20 1540)  Complications: No complications documented.

## 2020-04-08 NOTE — Op Note (Addendum)
OPERATIVE REPORT  DATE OF SURGERY: 04/08/2020  PATIENT:  Christopher Pittman,  24 y.o. male  PRE-OPERATIVE DIAGNOSIS:  tympanic membrane perforation, left  POST-OPERATIVE DIAGNOSIS:  tympanic membrane perforation, left  PROCEDURE:  Procedure(s): LEFT TYMPANOPLASTY  SURGEON:  Susy Frizzle, MD  ASSISTANTS: None  ANESTHESIA:   General   EBL: 20 ml  DRAINS: None  LOCAL MEDICATIONS USED: 1% Xylocaine with epinephrine  SPECIMEN:  none  COUNTS:  Correct  PROCEDURE DETAILS: The patient was taken to the operating room and placed on the operating table in the supine position. Following induction of general endotracheal anesthesia, the left ear was prepped and draped in standard fashion.  The operating microscope was draped and used throughout the case.  Ear canal was inspected initially and was clear.  The tympanic membrane revealed a clean dry perforation, central, just posterior to the manubrium of the malleus, approximately 20%.  The middle ear mucosa was healthy dry and clear.  The ear canal was injected with local anesthetic in 4 quadrants.  There is nice blanching of the tympanic membrane.  The edges of the perforation were rimmed using a sharp otologic pick and cup forceps.  A postauricular incision was used to access the temporalis fascia and a graft was harvested pressed and dried on the back table.  Incision was reapproximated with running subcuticular 3-0 chromic and Dermabond on the surface.  The graft was cut to size and shape and notched for the manubrium prior to insertion.  The posteriorly based tympanomeatal flap was brought forward exposing the middle ear.  The chorda tympani nerve was kept intact and just beyond the extent of the flap superiorly.  The middle ear was clean with healthy mucosa.  The ossicular chain was intact and normal in appearance with normal mobility.  The middle ear was packed with saline soaked Gelfoam.  The graft was then inserted in an underlay technique.   Additional Gelfoam packing was applied in order to provide extra support and keep the graft up against the undersurface of the edges of the perforation in all directions.  After the graft was in adequate position the ear canal was then packed with Ciprodex soaked Gelfoam.  A Glasscock dressing was applied.  Patient was awakened extubated and transferred to recovery in stable condition.    PATIENT DISPOSITION:  To PACU, stable

## 2020-04-08 NOTE — Anesthesia Postprocedure Evaluation (Signed)
Anesthesia Post Note  Patient: Christopher Pittman  Procedure(s) Performed: LEFT TYMPANOPLASTY (Left Ear)     Patient location during evaluation: PACU Anesthesia Type: General Level of consciousness: awake and alert Pain management: pain level controlled Vital Signs Assessment: post-procedure vital signs reviewed and stable Respiratory status: spontaneous breathing, nonlabored ventilation and respiratory function stable Cardiovascular status: blood pressure returned to baseline and stable Postop Assessment: no apparent nausea or vomiting Anesthetic complications: no   No complications documented.  Last Vitals:  Vitals:   04/08/20 0935 04/08/20 0957  BP: 127/78 128/86  Pulse: 72 80  Resp: 14 16  Temp:  36.9 C  SpO2: 100% 100%    Last Pain:  Vitals:   04/08/20 0957  TempSrc:   PainSc: 0-No pain                 Lowella Curb

## 2020-04-08 NOTE — Anesthesia Preprocedure Evaluation (Signed)
Anesthesia Evaluation  Patient identified by MRN, date of birth, ID band Patient awake    Reviewed: Allergy & Precautions, NPO status , Patient's Chart, lab work & pertinent test results  Airway Mallampati: II  TM Distance: >3 FB Neck ROM: Full    Dental no notable dental hx.    Pulmonary neg pulmonary ROS,    Pulmonary exam normal breath sounds clear to auscultation       Cardiovascular negative cardio ROS Normal cardiovascular exam Rhythm:Regular Rate:Normal     Neuro/Psych negative neurological ROS  negative psych ROS   GI/Hepatic negative GI ROS, Neg liver ROS,   Endo/Other  negative endocrine ROS  Renal/GU negative Renal ROS  negative genitourinary   Musculoskeletal negative musculoskeletal ROS (+)   Abdominal   Peds negative pediatric ROS (+)  Hematology negative hematology ROS (+)   Anesthesia Other Findings   Reproductive/Obstetrics negative OB ROS                             Anesthesia Physical Anesthesia Plan  ASA: I  Anesthesia Plan: General   Post-op Pain Management:    Induction: Intravenous  PONV Risk Score and Plan: 2 and Ondansetron, Midazolam and Treatment may vary due to age or medical condition  Airway Management Planned: LMA  Additional Equipment:   Intra-op Plan:   Post-operative Plan: Extubation in OR  Informed Consent: I have reviewed the patients History and Physical, chart, labs and discussed the procedure including the risks, benefits and alternatives for the proposed anesthesia with the patient or authorized representative who has indicated his/her understanding and acceptance.     Dental advisory given  Plan Discussed with: CRNA  Anesthesia Plan Comments:         Anesthesia Quick Evaluation  

## 2020-04-08 NOTE — Anesthesia Procedure Notes (Signed)
Procedure Name: LMA Insertion Date/Time: 04/08/2020 7:38 AM Performed by: Pearson Grippe, CRNA Pre-anesthesia Checklist: Patient identified, Emergency Drugs available, Suction available and Patient being monitored Patient Re-evaluated:Patient Re-evaluated prior to induction Oxygen Delivery Method: Circle system utilized Preoxygenation: Pre-oxygenation with 100% oxygen Induction Type: IV induction Ventilation: Mask ventilation without difficulty LMA: LMA inserted LMA Size: 4.0 Number of attempts: 1 Airway Equipment and Method: Bite block Placement Confirmation: positive ETCO2 Tube secured with: Tape Dental Injury: Teeth and Oropharynx as per pre-operative assessment

## 2020-04-08 NOTE — Discharge Instructions (Signed)
Avoid blowing your nose for the next 4 weeks.  If you have to sneeze make sure your mouth is open.  Keep all water out of the ear.  Tuesday morning you may remove the dressing.  Remove the Velcro strap from the forehead.  The little adhesive pad comes off as well.  Entire dressing comes off.  You may pull off the thin dressing behind the ear.  Remove the cotton ball from the ear, place the eardrops and then replace a fresh cottonball.  Repeat this 3 times daily.    Post Anesthesia Home Care Instructions  Activity: Get plenty of rest for the remainder of the day. A responsible individual must stay with you for 24 hours following the procedure.  For the next 24 hours, DO NOT: -Drive a car -Advertising copywriter -Drink alcoholic beverages -Take any medication unless instructed by your physician -Make any legal decisions or sign important papers.  Meals: Start with liquid foods such as gelatin or soup. Progress to regular foods as tolerated. Avoid greasy, spicy, heavy foods. If nausea and/or vomiting occur, drink only clear liquids until the nausea and/or vomiting subsides. Call your physician if vomiting continues.  Special Instructions/Symptoms: Your throat may feel dry or sore from the anesthesia or the breathing tube placed in your throat during surgery. If this causes discomfort, gargle with warm salt water. The discomfort should disappear within 24 hours.  If you had a scopolamine patch placed behind your ear for the management of post- operative nausea and/or vomiting:  1. The medication in the patch is effective for 72 hours, after which it should be removed.  Wrap patch in a tissue and discard in the trash. Wash hands thoroughly with soap and water. 2. You may remove the patch earlier than 72 hours if you experience unpleasant side effects which may include dry mouth, dizziness or visual disturbances. 3. Avoid touching the patch. Wash your hands with soap and water after contact with the  patch.

## 2020-04-09 ENCOUNTER — Encounter (HOSPITAL_BASED_OUTPATIENT_CLINIC_OR_DEPARTMENT_OTHER): Payer: Self-pay | Admitting: Otolaryngology

## 2020-07-30 DIAGNOSIS — H7202 Central perforation of tympanic membrane, left ear: Secondary | ICD-10-CM | POA: Diagnosis not present

## 2021-06-24 ENCOUNTER — Other Ambulatory Visit: Payer: BC Managed Care – PPO

## 2021-06-30 ENCOUNTER — Encounter: Payer: BC Managed Care – PPO | Admitting: Family Medicine

## 2021-07-08 ENCOUNTER — Ambulatory Visit: Payer: BC Managed Care – PPO | Admitting: Family Medicine

## 2021-07-14 ENCOUNTER — Observation Stay (HOSPITAL_COMMUNITY)
Admission: EM | Admit: 2021-07-14 | Discharge: 2021-07-15 | Disposition: A | Discharge status: Home / Self Care | Payer: Worker's Compensation | Attending: Orthopedic Surgery | Admitting: Orthopedic Surgery

## 2021-07-14 ENCOUNTER — Encounter (HOSPITAL_COMMUNITY): Payer: Self-pay | Admitting: Emergency Medicine

## 2021-07-14 ENCOUNTER — Emergency Department (HOSPITAL_COMMUNITY): Payer: Worker's Compensation

## 2021-07-14 ENCOUNTER — Other Ambulatory Visit: Payer: Self-pay

## 2021-07-14 ENCOUNTER — Encounter (HOSPITAL_COMMUNITY)
Admission: EM | Disposition: A | Discharge status: Home / Self Care | Payer: Self-pay | Source: Home / Self Care | Attending: Emergency Medicine

## 2021-07-14 ENCOUNTER — Emergency Department (HOSPITAL_COMMUNITY): Payer: Worker's Compensation | Admitting: Certified Registered"

## 2021-07-14 ENCOUNTER — Observation Stay (HOSPITAL_COMMUNITY): Payer: Worker's Compensation

## 2021-07-14 DIAGNOSIS — Z20822 Contact with and (suspected) exposure to covid-19: Secondary | ICD-10-CM | POA: Insufficient documentation

## 2021-07-14 DIAGNOSIS — T148XXA Other injury of unspecified body region, initial encounter: Secondary | ICD-10-CM

## 2021-07-14 DIAGNOSIS — Y990 Civilian activity done for income or pay: Secondary | ICD-10-CM | POA: Insufficient documentation

## 2021-07-14 DIAGNOSIS — S81812A Laceration without foreign body, left lower leg, initial encounter: Secondary | ICD-10-CM

## 2021-07-14 DIAGNOSIS — Z23 Encounter for immunization: Secondary | ICD-10-CM | POA: Insufficient documentation

## 2021-07-14 DIAGNOSIS — S82202A Unspecified fracture of shaft of left tibia, initial encounter for closed fracture: Secondary | ICD-10-CM | POA: Diagnosis present

## 2021-07-14 DIAGNOSIS — S8992XA Unspecified injury of left lower leg, initial encounter: Secondary | ICD-10-CM | POA: Diagnosis present

## 2021-07-14 DIAGNOSIS — E559 Vitamin D deficiency, unspecified: Secondary | ICD-10-CM | POA: Diagnosis present

## 2021-07-14 DIAGNOSIS — S82252B Displaced comminuted fracture of shaft of left tibia, initial encounter for open fracture type I or II: Principal | ICD-10-CM | POA: Insufficient documentation

## 2021-07-14 DIAGNOSIS — W240XXA Contact with lifting devices, not elsewhere classified, initial encounter: Secondary | ICD-10-CM | POA: Insufficient documentation

## 2021-07-14 DIAGNOSIS — Z79899 Other long term (current) drug therapy: Secondary | ICD-10-CM | POA: Insufficient documentation

## 2021-07-14 DIAGNOSIS — S82202B Unspecified fracture of shaft of left tibia, initial encounter for open fracture type I or II: Secondary | ICD-10-CM

## 2021-07-14 DIAGNOSIS — S8292XB Unspecified fracture of left lower leg, initial encounter for open fracture type I or II: Secondary | ICD-10-CM

## 2021-07-14 DIAGNOSIS — Z419 Encounter for procedure for purposes other than remedying health state, unspecified: Secondary | ICD-10-CM

## 2021-07-14 HISTORY — DX: Unspecified fracture of shaft of left tibia, initial encounter for open fracture type I or II: S82.202B

## 2021-07-14 HISTORY — PX: TIBIA IM NAIL INSERTION: SHX2516

## 2021-07-14 HISTORY — PX: I & D EXTREMITY: SHX5045

## 2021-07-14 LAB — CBC WITH DIFFERENTIAL/PLATELET
Abs Immature Granulocytes: 0.03 10*3/uL (ref 0.00–0.07)
Basophils Absolute: 0 10*3/uL (ref 0.0–0.1)
Basophils Relative: 0 %
Eosinophils Absolute: 0 10*3/uL (ref 0.0–0.5)
Eosinophils Relative: 0 %
HCT: 43.4 % (ref 39.0–52.0)
Hemoglobin: 14.9 g/dL (ref 13.0–17.0)
Immature Granulocytes: 0 %
Lymphocytes Relative: 7 %
Lymphs Abs: 0.8 10*3/uL (ref 0.7–4.0)
MCH: 30.9 pg (ref 26.0–34.0)
MCHC: 34.3 g/dL (ref 30.0–36.0)
MCV: 90 fL (ref 80.0–100.0)
Monocytes Absolute: 0.9 10*3/uL (ref 0.1–1.0)
Monocytes Relative: 8 %
Neutro Abs: 9.7 10*3/uL — ABNORMAL HIGH (ref 1.7–7.7)
Neutrophils Relative %: 85 %
Platelets: 208 10*3/uL (ref 150–400)
RBC: 4.82 MIL/uL (ref 4.22–5.81)
RDW: 11.2 % — ABNORMAL LOW (ref 11.5–15.5)
WBC: 11.5 10*3/uL — ABNORMAL HIGH (ref 4.0–10.5)
nRBC: 0 % (ref 0.0–0.2)

## 2021-07-14 LAB — CBC
HCT: 39 % (ref 39.0–52.0)
Hemoglobin: 13 g/dL (ref 13.0–17.0)
MCH: 30.4 pg (ref 26.0–34.0)
MCHC: 33.3 g/dL (ref 30.0–36.0)
MCV: 91.3 fL (ref 80.0–100.0)
Platelets: 188 10*3/uL (ref 150–400)
RBC: 4.27 MIL/uL (ref 4.22–5.81)
RDW: 11.3 % — ABNORMAL LOW (ref 11.5–15.5)
WBC: 6.1 10*3/uL (ref 4.0–10.5)
nRBC: 0 % (ref 0.0–0.2)

## 2021-07-14 LAB — POCT I-STAT, CHEM 8
BUN: 17 mg/dL (ref 6–20)
Calcium, Ion: 1.17 mmol/L (ref 1.15–1.40)
Chloride: 102 mmol/L (ref 98–111)
Creatinine, Ser: 1.1 mg/dL (ref 0.61–1.24)
Glucose, Bld: 103 mg/dL — ABNORMAL HIGH (ref 70–99)
HCT: 46 % (ref 39.0–52.0)
Hemoglobin: 15.6 g/dL (ref 13.0–17.0)
Potassium: 4.1 mmol/L (ref 3.5–5.1)
Sodium: 137 mmol/L (ref 135–145)
TCO2: 26 mmol/L (ref 22–32)

## 2021-07-14 LAB — SARS CORONAVIRUS 2 BY RT PCR (HOSPITAL ORDER, PERFORMED IN ~~LOC~~ HOSPITAL LAB): SARS Coronavirus 2: NEGATIVE

## 2021-07-14 LAB — CREATININE, SERUM
Creatinine, Ser: 1.02 mg/dL (ref 0.61–1.24)
GFR, Estimated: >60 mL/min (ref >60)

## 2021-07-14 LAB — SURGICAL PCR SCREEN
MRSA, PCR: NEGATIVE
Staphylococcus aureus: NEGATIVE

## 2021-07-14 SURGERY — IRRIGATION AND DEBRIDEMENT EXTREMITY
Anesthesia: General | Site: Leg Lower | Laterality: Left

## 2021-07-14 MED ORDER — CEPHALEXIN 500 MG PO CAPS
500.0000 mg | ORAL_CAPSULE | Freq: Four times a day (QID) | ORAL | 0 refills | Status: DC
Start: 2021-07-14 — End: 2021-07-14

## 2021-07-14 MED ORDER — LACTATED RINGERS IV SOLN: INTRAVENOUS | Status: DC | PRN

## 2021-07-14 MED ORDER — LACTATED RINGERS IV SOLN: INTRAVENOUS | Status: DC

## 2021-07-14 MED ORDER — METHOCARBAMOL 500 MG PO TABS
1000.0000 mg | ORAL_TABLET | Freq: Three times a day (TID) | ORAL | Status: DC
  Administered 2021-07-14 – 2021-07-15 (×3): 1000 mg via ORAL
  Filled 2021-07-14 (×3): qty 2

## 2021-07-14 MED ORDER — FENTANYL CITRATE PF 50 MCG/ML IJ SOSY
50.0000 ug | PREFILLED_SYRINGE | Freq: Once | INTRAMUSCULAR | Status: AC
  Administered 2021-07-14: 50 ug via INTRAVENOUS
  Filled 2021-07-14: qty 1

## 2021-07-14 MED ORDER — 0.9 % SODIUM CHLORIDE (POUR BTL) OPTIME
TOPICAL | Status: DC | PRN
  Administered 2021-07-14: 1000 mL

## 2021-07-14 MED ORDER — AMISULPRIDE (ANTIEMETIC) 5 MG/2ML IV SOLN: 10.0000 mg | Freq: Once | INTRAVENOUS | Status: DC | PRN

## 2021-07-14 MED ORDER — OXYCODONE HCL 5 MG PO TABS: 5.0000 mg | ORAL_TABLET | ORAL | Status: DC | PRN

## 2021-07-14 MED ORDER — ROCURONIUM BROMIDE 100 MG/10ML IV SOLN: INTRAVENOUS | Status: DC | PRN

## 2021-07-14 MED ORDER — ONDANSETRON HCL 4 MG PO TABS: 4.0000 mg | ORAL_TABLET | Freq: Four times a day (QID) | ORAL | Status: DC | PRN

## 2021-07-14 MED ORDER — MONTELUKAST SODIUM 10 MG PO TABS
10.0000 mg | ORAL_TABLET | Freq: Every day | ORAL | Status: DC
  Administered 2021-07-14: 10 mg via ORAL
  Filled 2021-07-14: qty 1

## 2021-07-14 MED ORDER — TETANUS-DIPHTH-ACELL PERTUSSIS 5-2.5-18.5 LF-MCG/0.5 IM SUSY
0.5000 mL | PREFILLED_SYRINGE | Freq: Once | INTRAMUSCULAR | Status: AC
  Administered 2021-07-14: 0.5 mL via INTRAMUSCULAR
  Filled 2021-07-14: qty 0.5

## 2021-07-14 MED ORDER — SUFENTANIL CITRATE 50 MCG/ML IV SOLN
INTRAVENOUS | Status: DC | PRN
  Administered 2021-07-14 (×3): 10 ug via INTRAVENOUS

## 2021-07-14 MED ORDER — MIDAZOLAM HCL 2 MG/2ML IJ SOLN
INTRAMUSCULAR | Status: DC | PRN
  Administered 2021-07-14: 2 mg via INTRAVENOUS

## 2021-07-14 MED ORDER — CHLORHEXIDINE GLUCONATE 0.12 % MT SOLN
OROMUCOSAL | Status: AC
  Administered 2021-07-14: 15 mL via OROMUCOSAL
  Filled 2021-07-14: qty 15

## 2021-07-14 MED ORDER — OXYCODONE HCL 5 MG PO TABS: 10.0000 mg | ORAL_TABLET | ORAL | Status: DC | PRN

## 2021-07-14 MED ORDER — IBUPROFEN 800 MG PO TABS
800.0000 mg | ORAL_TABLET | Freq: Once | ORAL | Status: AC
  Administered 2021-07-14: 800 mg via ORAL
  Filled 2021-07-14: qty 1

## 2021-07-14 MED ORDER — SODIUM CHLORIDE 0.9 % IV BOLUS
500.0000 mL | Freq: Once | INTRAVENOUS | Status: AC
  Administered 2021-07-14: 500 mL via INTRAVENOUS

## 2021-07-14 MED ORDER — ASCORBIC ACID 500 MG PO TABS
500.0000 mg | ORAL_TABLET | Freq: Every day | ORAL | Status: DC
  Administered 2021-07-14 – 2021-07-15 (×2): 500 mg via ORAL
  Filled 2021-07-14 (×2): qty 1

## 2021-07-14 MED ORDER — MIDAZOLAM HCL 2 MG/2ML IJ SOLN
INTRAMUSCULAR | Status: AC
  Filled 2021-07-14: qty 2

## 2021-07-14 MED ORDER — ROCURONIUM 10MG/ML (10ML) SYRINGE FOR MEDFUSION PUMP - OPTIME
INTRAVENOUS | Status: DC | PRN
  Administered 2021-07-14: 60 mg via INTRAVENOUS

## 2021-07-14 MED ORDER — OXYCODONE HCL 5 MG/5ML PO SOLN: 5.0000 mg | Freq: Once | ORAL | Status: DC | PRN

## 2021-07-14 MED ORDER — SORBITOL 70 % SOLN
30.0000 mL | Freq: Every day | Status: DC | PRN
  Filled 2021-07-14: qty 30

## 2021-07-14 MED ORDER — CEFAZOLIN SODIUM-DEXTROSE 2-4 GM/100ML-% IV SOLN
2.0000 g | Freq: Three times a day (TID) | INTRAVENOUS | Status: AC
  Administered 2021-07-14 – 2021-07-15 (×3): 2 g via INTRAVENOUS
  Filled 2021-07-14 (×3): qty 100

## 2021-07-14 MED ORDER — ACETAMINOPHEN 500 MG PO TABS
1000.0000 mg | ORAL_TABLET | Freq: Three times a day (TID) | ORAL | Status: DC
  Administered 2021-07-14 – 2021-07-15 (×3): 1000 mg via ORAL
  Filled 2021-07-14 (×3): qty 2

## 2021-07-14 MED ORDER — CEFAZOLIN SODIUM-DEXTROSE 2-4 GM/100ML-% IV SOLN
2.0000 g | Freq: Once | INTRAVENOUS | Status: AC
  Administered 2021-07-14: 2 g via INTRAVENOUS

## 2021-07-14 MED ORDER — FENTANYL CITRATE (PF) 100 MCG/2ML IJ SOLN: 25.0000 ug | INTRAMUSCULAR | Status: DC | PRN

## 2021-07-14 MED ORDER — ONDANSETRON HCL 4 MG/2ML IJ SOLN
INTRAMUSCULAR | Status: DC | PRN
  Administered 2021-07-14: 4 mg via INTRAVENOUS

## 2021-07-14 MED ORDER — LORATADINE 10 MG PO TABS
10.0000 mg | ORAL_TABLET | Freq: Every day | ORAL | Status: DC
  Administered 2021-07-14 – 2021-07-15 (×2): 10 mg via ORAL
  Filled 2021-07-14 (×2): qty 1

## 2021-07-14 MED ORDER — HYDROMORPHONE HCL 1 MG/ML IJ SOLN: 0.5000 mg | INTRAMUSCULAR | Status: DC | PRN

## 2021-07-14 MED ORDER — METOCLOPRAMIDE HCL 5 MG/ML IJ SOLN: 5.0000 mg | Freq: Three times a day (TID) | INTRAMUSCULAR | Status: DC | PRN

## 2021-07-14 MED ORDER — CHLORHEXIDINE GLUCONATE 0.12 % MT SOLN: 15.0000 mL | Freq: Once | OROMUCOSAL | Status: AC

## 2021-07-14 MED ORDER — SUGAMMADEX SODIUM 200 MG/2ML IV SOLN
INTRAVENOUS | Status: DC | PRN
  Administered 2021-07-14: 200 mg via INTRAVENOUS

## 2021-07-14 MED ORDER — ACETAMINOPHEN 10 MG/ML IV SOLN
INTRAVENOUS | Status: DC | PRN
  Administered 2021-07-14: 1000 mg via INTRAVENOUS

## 2021-07-14 MED ORDER — DEXAMETHASONE SODIUM PHOSPHATE 10 MG/ML IJ SOLN
INTRAMUSCULAR | Status: DC | PRN
  Administered 2021-07-14: 10 mg via INTRAVENOUS

## 2021-07-14 MED ORDER — KETOROLAC TROMETHAMINE 30 MG/ML IJ SOLN
15.0000 mg | Freq: Once | INTRAMUSCULAR | Status: AC
  Administered 2021-07-14: 15 mg via INTRAVENOUS
  Filled 2021-07-14: qty 1

## 2021-07-14 MED ORDER — DOCUSATE SODIUM 100 MG PO CAPS
100.0000 mg | ORAL_CAPSULE | Freq: Two times a day (BID) | ORAL | Status: DC
  Administered 2021-07-14: 100 mg via ORAL
  Filled 2021-07-14 (×2): qty 1

## 2021-07-14 MED ORDER — OXYCODONE-ACETAMINOPHEN 5-325 MG PO TABS: 1.0000 | ORAL_TABLET | Freq: Four times a day (QID) | ORAL | 0 refills | Status: DC | PRN

## 2021-07-14 MED ORDER — SUFENTANIL CITRATE 50 MCG/ML IV SOLN
INTRAVENOUS | Status: AC
  Filled 2021-07-14: qty 1

## 2021-07-14 MED ORDER — PROPOFOL 10 MG/ML IV BOLUS
INTRAVENOUS | Status: DC | PRN
  Administered 2021-07-14: 150 mg via INTRAVENOUS

## 2021-07-14 MED ORDER — ORAL CARE MOUTH RINSE: 15.0000 mL | Freq: Once | OROMUCOSAL | Status: AC

## 2021-07-14 MED ORDER — OXYCODONE HCL 5 MG PO TABS: 5.0000 mg | ORAL_TABLET | Freq: Once | ORAL | Status: DC | PRN

## 2021-07-14 MED ORDER — KETOROLAC TROMETHAMINE 15 MG/ML IJ SOLN
15.0000 mg | Freq: Four times a day (QID) | INTRAMUSCULAR | Status: DC
  Administered 2021-07-14 – 2021-07-15 (×4): 15 mg via INTRAVENOUS
  Filled 2021-07-14 (×4): qty 1

## 2021-07-14 MED ORDER — CEFAZOLIN SODIUM-DEXTROSE 2-4 GM/100ML-% IV SOLN
2.0000 g | Freq: Once | INTRAVENOUS | Status: AC
  Administered 2021-07-14: 2 g via INTRAVENOUS
  Filled 2021-07-14: qty 100

## 2021-07-14 MED ORDER — METHOCARBAMOL 1000 MG/10ML IJ SOLN
1000.0000 mg | Freq: Three times a day (TID) | INTRAVENOUS | Status: DC
  Filled 2021-07-14: qty 10

## 2021-07-14 MED ORDER — PROMETHAZINE HCL 25 MG/ML IJ SOLN: 6.2500 mg | INTRAMUSCULAR | Status: DC | PRN

## 2021-07-14 MED ORDER — MORPHINE SULFATE (PF) 4 MG/ML IV SOLN
4.0000 mg | Freq: Once | INTRAVENOUS | Status: AC
  Administered 2021-07-14: 4 mg via INTRAVENOUS
  Filled 2021-07-14: qty 1

## 2021-07-14 MED ORDER — CEFAZOLIN SODIUM-DEXTROSE 2-4 GM/100ML-% IV SOLN
INTRAVENOUS | Status: AC
  Filled 2021-07-14: qty 100

## 2021-07-14 MED ORDER — LIDOCAINE HCL (CARDIAC) PF 100 MG/5ML IV SOSY
PREFILLED_SYRINGE | INTRAVENOUS | Status: DC | PRN
  Administered 2021-07-14: 100 mg via INTRAVENOUS

## 2021-07-14 MED ORDER — POTASSIUM CHLORIDE IN NACL 20-0.9 MEQ/L-% IV SOLN
INTRAVENOUS | Status: DC
  Filled 2021-07-14 (×2): qty 1000

## 2021-07-14 MED ORDER — ONDANSETRON HCL 4 MG/2ML IJ SOLN: 4.0000 mg | Freq: Four times a day (QID) | INTRAMUSCULAR | Status: DC | PRN

## 2021-07-14 MED ORDER — METOCLOPRAMIDE HCL 5 MG PO TABS: 5.0000 mg | ORAL_TABLET | Freq: Three times a day (TID) | ORAL | Status: DC | PRN

## 2021-07-14 MED ORDER — ACETAMINOPHEN 10 MG/ML IV SOLN: 1000.0000 mg | Freq: Once | INTRAVENOUS | Status: DC | PRN

## 2021-07-14 MED ORDER — ACETAMINOPHEN 325 MG PO TABS: 325.0000 mg | ORAL_TABLET | Freq: Four times a day (QID) | ORAL | Status: DC | PRN

## 2021-07-14 MED ORDER — PROPOFOL 10 MG/ML IV BOLUS
INTRAVENOUS | Status: AC
  Filled 2021-07-14: qty 20

## 2021-07-14 MED ORDER — ENOXAPARIN SODIUM 40 MG/0.4ML IJ SOSY
40.0000 mg | PREFILLED_SYRINGE | INTRAMUSCULAR | Status: DC
Start: 2021-07-15 — End: 2021-07-15
  Administered 2021-07-15: 40 mg via SUBCUTANEOUS
  Filled 2021-07-14: qty 0.4

## 2021-07-14 SURGICAL SUPPLY — 91 items
BAG COUNTER SPONGE SURGICOUNT (BAG) ×3 IMPLANT
BANDAGE ESMARK 6X9 LF (GAUZE/BANDAGES/DRESSINGS) ×2 IMPLANT
BIT DRILL CALIBRATED 4.2 (BIT) ×2 IMPLANT
BIT DRILL SHORT 4.2 (BIT) ×4 IMPLANT
BLADE CLIPPER SURG (BLADE) IMPLANT
BLADE SURG 10 STRL SS (BLADE) ×6 IMPLANT
BLADE SURG 15 STRL LF DISP TIS (BLADE) ×2 IMPLANT
BLADE SURG 15 STRL SS (BLADE) ×3
BNDG COHESIVE 4X5 TAN STRL (GAUZE/BANDAGES/DRESSINGS) ×3 IMPLANT
BNDG ELASTIC 4X5.8 VLCR STR LF (GAUZE/BANDAGES/DRESSINGS) ×3 IMPLANT
BNDG ELASTIC 6X5.8 VLCR STR LF (GAUZE/BANDAGES/DRESSINGS) ×3 IMPLANT
BNDG ESMARK 6X9 LF (GAUZE/BANDAGES/DRESSINGS) ×3
BNDG GAUZE ELAST 4 BULKY (GAUZE/BANDAGES/DRESSINGS) ×6 IMPLANT
BRUSH SCRUB EZ PLAIN DRY (MISCELLANEOUS) ×6 IMPLANT
CANISTER SUCT 3000ML PPV (MISCELLANEOUS) ×3 IMPLANT
COVER SURGICAL LIGHT HANDLE (MISCELLANEOUS) ×6 IMPLANT
CUFF TOURN SGL QUICK 34 (TOURNIQUET CUFF) ×3
CUFF TRNQT CYL 34X4.125X (TOURNIQUET CUFF) ×2 IMPLANT
DRAPE C-ARM 42X72 X-RAY (DRAPES) ×3 IMPLANT
DRAPE C-ARMOR (DRAPES) ×3 IMPLANT
DRAPE HALF SHEET 40X57 (DRAPES) IMPLANT
DRAPE INCISE IOBAN 66X45 STRL (DRAPES) ×3 IMPLANT
DRAPE U-SHAPE 47X51 STRL (DRAPES) ×3 IMPLANT
DRILL BIT CALIBRATED 4.2 (BIT) ×3
DRILL BIT SHORT 4.2 (BIT) ×6
DRSG ADAPTIC 3X8 NADH LF (GAUZE/BANDAGES/DRESSINGS) ×6 IMPLANT
DRSG PAD ABDOMINAL 8X10 ST (GAUZE/BANDAGES/DRESSINGS) ×3 IMPLANT
ELECT REM PT RETURN 9FT ADLT (ELECTROSURGICAL) ×3
ELECTRODE REM PT RTRN 9FT ADLT (ELECTROSURGICAL) ×2 IMPLANT
GAUZE SPONGE 4X4 12PLY STRL (GAUZE/BANDAGES/DRESSINGS) ×3 IMPLANT
GAUZE SPONGE 4X4 12PLY STRL LF (GAUZE/BANDAGES/DRESSINGS) ×3 IMPLANT
GLOVE SRG 8 PF TXTR STRL LF DI (GLOVE) ×2 IMPLANT
GLOVE SURG ENC MOIS LTX SZ7.5 (GLOVE) ×3 IMPLANT
GLOVE SURG ENC MOIS LTX SZ8 (GLOVE) ×3 IMPLANT
GLOVE SURG UNDER POLY LF SZ7.5 (GLOVE) ×3 IMPLANT
GLOVE SURG UNDER POLY LF SZ8 (GLOVE) ×3
GLOVE SURG UNDER POLY LF SZ9 (GLOVE) ×3 IMPLANT
GOWN STRL REUS W/ TWL LRG LVL3 (GOWN DISPOSABLE) ×4 IMPLANT
GOWN STRL REUS W/ TWL XL LVL3 (GOWN DISPOSABLE) ×2 IMPLANT
GOWN STRL REUS W/TWL LRG LVL3 (GOWN DISPOSABLE) ×6
GOWN STRL REUS W/TWL XL LVL3 (GOWN DISPOSABLE) ×3
GUIDEWIRE 3.2X400 (WIRE) ×3 IMPLANT
HANDPIECE INTERPULSE COAX TIP (DISPOSABLE)
IMMOBILIZER KNEE 22 UNIV (SOFTGOODS) ×3 IMPLANT
KIT BASIN OR (CUSTOM PROCEDURE TRAY) ×3 IMPLANT
KIT TURNOVER KIT B (KITS) ×3 IMPLANT
MANIFOLD NEPTUNE II (INSTRUMENTS) ×3 IMPLANT
NAIL TIB TFNA 9X375 (Nail) ×3 IMPLANT
NDL SUT 6 .5 CRC .975X.05 MAYO (NEEDLE) IMPLANT
NEEDLE MAYO TAPER (NEEDLE)
NS IRRIG 1000ML POUR BTL (IV SOLUTION) ×3 IMPLANT
PACK ORTHO EXTREMITY (CUSTOM PROCEDURE TRAY) ×3 IMPLANT
PAD ABD 8X10 STRL (GAUZE/BANDAGES/DRESSINGS) ×3 IMPLANT
PAD ARMBOARD 7.5X6 YLW CONV (MISCELLANEOUS) ×6 IMPLANT
PAD CAST 4YDX4 CTTN HI CHSV (CAST SUPPLIES) ×2 IMPLANT
PADDING CAST ABS 4INX4YD NS (CAST SUPPLIES) ×1
PADDING CAST ABS 6INX4YD NS (CAST SUPPLIES) ×1
PADDING CAST ABS COTTON 4X4 ST (CAST SUPPLIES) ×2 IMPLANT
PADDING CAST ABS COTTON 6X4 NS (CAST SUPPLIES) ×2 IMPLANT
PADDING CAST COTTON 4X4 STRL (CAST SUPPLIES) ×3
PADDING CAST COTTON 6X4 STRL (CAST SUPPLIES) ×3 IMPLANT
REAMER ROD DEEP FLUTE 2.5X950 (INSTRUMENTS) ×3 IMPLANT
SCREW LOCK IM 5X52 (Screw) ×3 IMPLANT
SCREW LOCK IM NAIL 5X34 (Screw) ×3 IMPLANT
SCREW LOCK IM TI 5X30 STRL (Screw) ×3 IMPLANT
SET HNDPC FAN SPRY TIP SCT (DISPOSABLE) IMPLANT
SOL PREP POV-IOD 4OZ 10% (MISCELLANEOUS) ×3 IMPLANT
SOL PREP PROV IODINE SCRUB 4OZ (MISCELLANEOUS) ×3 IMPLANT
SPONGE T-LAP 18X18 ~~LOC~~+RFID (SPONGE) ×3 IMPLANT
STAPLER VISISTAT 35W (STAPLE) ×3 IMPLANT
STOCKINETTE IMPERVIOUS 9X36 MD (GAUZE/BANDAGES/DRESSINGS) IMPLANT
STOCKINETTE IMPERVIOUS LG (DRAPES) ×3 IMPLANT
SUCTION FRAZIER HANDLE 10FR (MISCELLANEOUS) ×3
SUCTION TUBE FRAZIER 10FR DISP (MISCELLANEOUS) ×2 IMPLANT
SUT ETHILON 2 0 FS 18 (SUTURE) IMPLANT
SUT ETHILON 2 0 PSLX (SUTURE) ×3 IMPLANT
SUT PDS AB 2-0 CT1 27 (SUTURE) ×3 IMPLANT
SUT PROLENE 0 CT 2 (SUTURE) ×6 IMPLANT
SUT VIC AB 0 CT1 27 (SUTURE) ×3
SUT VIC AB 0 CT1 27XBRD ANBCTR (SUTURE) ×2 IMPLANT
SUT VIC AB 1 CT1 27 (SUTURE) ×3
SUT VIC AB 1 CT1 27XBRD ANBCTR (SUTURE) ×2 IMPLANT
SUT VIC AB 2-0 CT1 27 (SUTURE) ×6
SUT VIC AB 2-0 CT1 TAPERPNT 27 (SUTURE) ×4 IMPLANT
TOWEL GREEN STERILE (TOWEL DISPOSABLE) ×6 IMPLANT
TOWEL GREEN STERILE FF (TOWEL DISPOSABLE) ×3 IMPLANT
TRAY FOLEY MTR SLVR 16FR STAT (SET/KITS/TRAYS/PACK) IMPLANT
TUBE CONNECTING 12X1/4 (SUCTIONS) ×3 IMPLANT
UNDERPAD 30X36 HEAVY ABSORB (UNDERPADS AND DIAPERS) ×3 IMPLANT
WATER STERILE IRR 1000ML POUR (IV SOLUTION) ×6 IMPLANT
YANKAUER SUCT BULB TIP NO VENT (SUCTIONS) ×3 IMPLANT

## 2021-07-14 NOTE — ED Triage Notes (Signed)
Pt from work where coworker swung into lateral aspect of lower left leg w forklift. CMS intact distal to injury, bleeding controlled in triage; dressing changed. Pt unsure of last tetanus shot.

## 2021-07-14 NOTE — Anesthesia Postprocedure Evaluation (Signed)
Anesthesia Post Note  Patient: [name]  Procedure(s) Performed: IRRIGATION AND DEBRIDEMENT EXTREMITY (Left: Leg Lower) INTRAMEDULLARY (IM) NAIL TIBIAL (Left: Leg Lower)     Patient location during evaluation: PACU Anesthesia Type: General Level of consciousness: awake Pain management: pain level controlled Vital Signs Assessment: post-procedure vital signs reviewed and stable Respiratory status: spontaneous breathing, nonlabored ventilation, respiratory function stable and patient connected to nasal cannula oxygen Cardiovascular status: blood pressure returned to baseline and stable Postop Assessment: no apparent nausea or vomiting Anesthetic complications: no   No notable events documented.  Last Vitals:  Vitals:   07/14/21 1518 07/14/21 1534  BP: 123/80 122/74  Pulse: 70 100  Resp: 12 14  Temp:  36.9 C  SpO2: 98% 100%    Last Pain:  Vitals:   07/14/21 1824  TempSrc:   PainSc: 0-No pain                 Rand Etchison P Damean Poffenberger

## 2021-07-14 NOTE — Anesthesia Procedure Notes (Signed)
Procedure Name: Intubation Date/Time: 07/14/2021 11:05 AM Performed by: Claris Che, CRNA Pre-anesthesia Checklist: Patient identified, Emergency Drugs available, Suction available, Patient being monitored and Timeout performed Patient Re-evaluated:Patient Re-evaluated prior to induction Oxygen Delivery Method: Circle system utilized Preoxygenation: Pre-oxygenation with 100% oxygen Induction Type: IV induction and Cricoid Pressure applied Ventilation: Mask ventilation without difficulty Laryngoscope Size: Mac and 4 Grade View: Grade I Tube type: Oral Tube size: 8.0 mm Number of attempts: 1 Airway Equipment and Method: Stylet Placement Confirmation: ETT inserted through vocal cords under direct vision, positive ETCO2 and breath sounds checked- equal and bilateral Secured at: 24 cm Tube secured with: Tape Dental Injury: Teeth and Oropharynx as per pre-operative assessment

## 2021-07-14 NOTE — Anesthesia Preprocedure Evaluation (Signed)
Anesthesia Evaluation  Patient identified by MRN, date of birth, ID band Patient awake    Reviewed: Allergy & Precautions, NPO status , Patient's Chart, lab work & pertinent test results  Airway Mallampati: II  TM Distance: >3 FB Neck ROM: Full    Dental no notable dental hx.    Pulmonary neg pulmonary ROS,    Pulmonary exam normal breath sounds clear to auscultation       Cardiovascular negative cardio ROS Normal cardiovascular exam Rhythm:Regular Rate:Normal     Neuro/Psych negative neurological ROS  negative psych ROS   GI/Hepatic negative GI ROS, Neg liver ROS,   Endo/Other  negative endocrine ROS  Renal/GU negative Renal ROS     Musculoskeletal negative musculoskeletal ROS (+)   Abdominal   Peds  Hematology negative hematology ROS (+)   Anesthesia Other Findings LEFT TIBIAL FX COMPLEX LACERATION LLL  POSS COMPARTMENT SYNDROME  Reproductive/Obstetrics                             Anesthesia Physical Anesthesia Plan  ASA: 1  Anesthesia Plan: General   Post-op Pain Management:    Induction: Intravenous  PONV Risk Score and Plan: 2 and Dexamethasone, Ondansetron, Midazolam and Treatment may vary due to age or medical condition  Airway Management Planned: Oral ETT  Additional Equipment:   Intra-op Plan:   Post-operative Plan: Extubation in OR  Informed Consent: I have reviewed the patients History and Physical, chart, labs and discussed the procedure including the risks, benefits and alternatives for the proposed anesthesia with the patient or authorized representative who has indicated his/her understanding and acceptance.     Dental advisory given  Plan Discussed with: CRNA  Anesthesia Plan Comments:         Anesthesia Quick Evaluation

## 2021-07-14 NOTE — Plan of Care (Signed)

## 2021-07-14 NOTE — ED Notes (Signed)
Report given to Ginger G, RN of [facility]

## 2021-07-14 NOTE — TOC Initial Note (Signed)
Transition of Care [facility]) - Initial/Assessment Note    Patient Details  Name: Christopher Pittman MRN: [id number] Date of Birth: 1996-10-07  [facility] CM/SW Contact:    Christopher Lesches, RN Phone Number: 07/14/2021, 4:48 PM  Clinical Narrative:                 Admitted with Left open tibia fracture. Suffered injury while working @ [employer]. PTA independent with ADL's , no DME usage. Resides with parents.   NCM spoke with pt regarding  d/c planning. Pt without workman's comp. claim #/ information.Marland Kitchen Pt provided NCM with manager's # (Christopher Pittman, [phone]). NCM called manager to obtain claim information. Call unsuccessful. Voice message left.  PT/OT  evaluations pending.  TOC team following and will assist with needs....  Expected Discharge Plan: Home/Self Care Barriers to Discharge: Continued Medical Work up   Patient Goals and CMS Choice        Expected Discharge Plan and Services Expected Discharge Plan: Home/Self Care       Living arrangements for the past 2 months: Single Family Home                                      Prior Living Arrangements/Services Living arrangements for the past 2 months: Single Family Home Lives with:: Parents Patient language and need for interpreter reviewed:: Yes        Need for Family Participation in Patient Care: Yes (Comment) Care giver support system in place?: Yes (comment)   Criminal Activity/Legal Involvement Pertinent to Current Situation/Hospitalization: No - Comment as needed  Activities of Daily Living   ADL Screening (condition at time of admission) Patient's cognitive ability adequate to safely complete daily activities?: No  Permission Sought/Granted   Permission granted to share information with : Yes, Verbal Permission Granted  Share Information with NAME: Christopher Pittman (Mother)  [phone]           Emotional Assessment Appearance:: Appears stated age Attitude/Demeanor/Rapport:  Gracious Affect (typically observed): Accepting Orientation: : Oriented to Self, Oriented to Place, Oriented to  Time, Oriented to Situation Alcohol / Substance Use: Not Applicable Psych Involvement: No (comment)  Admission diagnosis:  Laceration of left lower extremity, initial encounter [S81.812A] Type I or II open fracture of left lower extremity, initial encounter [S82.92XB] Closed fracture of shaft of left tibia [S82.202A] Patient Active Problem List   Diagnosis Date Noted   Closed fracture of shaft of left tibia 07/14/2021   PCP:  Chrys Racer, MD Pharmacy:   [facility] [phone] Nicholes Rough [facility] - [address] [place] [address] Phone: [phone] Fax: [phone]     Social Determinants of Health (SDOH) Interventions    Readmission Risk Interventions No flowsheet data found.

## 2021-07-14 NOTE — Consult Note (Signed)
Reason for Consult:Left tibia fx Referring Physician: [place] Shelton Time called: 0730 Time at bedside: 0809   Christopher Pittman is an 25 y.o. male.  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 He had immediate pain and could not bear weight. He was brought to the ED where x-rays showed a tibia fx and orthopedic surgery was consulted.   Past Medical History:  Diagnosis Date   Seasonal allergies     Past Surgical History:  Procedure Laterality Date   ADENOIDECTOMY     MYRINGOTOMY     TYMPANOPLASTY Left 04/08/2020   Procedure: LEFT TYMPANOPLASTY;  Surgeon: [name], MD;  Location: [facility];  Service: ENT;  Laterality: Left;    History reviewed. No pertinent family history.  Social History:  reports that he has never smoked. He has never used smokeless tobacco. He reports that he does not drink alcohol and does not use drugs.  Allergies: No Known Allergies  Medications: I have reviewed the patient's current medications.  Results for orders placed or performed during the hospital encounter of 07/14/21 (from the past 48 hour(s))  CBC with Differential/Platelet     Status: Abnormal   Collection Time: 07/14/21  4:36 AM  Result Value Ref Range   WBC 11.5 (H) 4.0 - 10.5 K/uL   RBC 4.82 4.22 - 5.81 MIL/uL   Hemoglobin 14.9 13.0 - 17.0 g/dL   HCT 76.1 60.7 - 37.1 %   MCV 90.0 80.0 - 100.0 fL   MCH 30.9 26.0 - 34.0 pg   MCHC 34.3 30.0 - 36.0 g/dL   RDW 06.2 (L) 69.4 - 85.4 %   Platelets 208 150 - 400 K/uL   nRBC 0.0 0.0 - 0.2 %   Neutrophils Relative % 85 %   Neutro Abs 9.7 (H) 1.7 - 7.7 K/uL   Lymphocytes Relative 7 %   Lymphs Abs 0.8 0.7 - 4.0 K/uL   Monocytes Relative 8 %   Monocytes Absolute 0.9 0.1 - 1.0 K/uL   Eosinophils Relative 0 %   Eosinophils Absolute 0.0 0.0 - 0.5 K/uL   Basophils Relative 0 %   Basophils Absolute 0.0 0.0 - 0.1 K/uL   Immature Granulocytes 0 %   Abs Immature Granulocytes  0.03 0.00 - 0.07 K/uL    Comment: Performed at [facility], [address] [place], [place] [address]  I-STAT, [place]     Status: Abnormal   Collection Time: 07/14/21  5:16 AM  Result Value Ref Range   Sodium 137 135 - 145 mmol/L   Potassium 4.1 3.5 - 5.1 mmol/L   Chloride 102 98 - 111 mmol/L   BUN 17 6 - 20 mg/dL   Creatinine, Ser 5.00 0.61 - 1.24 mg/dL   Glucose, Bld 938 (H) 70 - 99 mg/dL    Comment: Glucose reference range applies only to samples taken after fasting for at least 8 hours.   Calcium, Ion 1.17 1.15 - 1.40 mmol/L   TCO2 26 22 - 32 mmol/L   Hemoglobin 15.6 13.0 - 17.0 g/dL   HCT 18.2 99.3 - 71.6 %    DG Ankle Complete Left  Result Date: 07/14/2021 CLINICAL DATA:  Status post trauma. EXAM: LEFT ANKLE COMPLETE - 3+ VIEW COMPARISON:  None. FINDINGS: An acute, nondisplaced fracture is seen involving the mid shaft of the left tibia. There is no evidence of dislocation. An ill-defined lateral soft tissue defect is  seen at the level of the previously noted fracture site. A small amount of soft tissue air is also noted. IMPRESSION: Acute, nondisplaced fracture of the mid shaft of the left tibia. Electronically Signed   By: Aram Candela M.D.   On: 07/14/2021 01:34   CT TIBIA FIBULA LEFT WO CONTRAST  Result Date: 07/14/2021 CLINICAL DATA:  25 year old male with history of trauma to the left lower extremity after being struck by a forklift. EXAM: CT OF THE LOWER LEFT EXTREMITY WITHOUT CONTRAST TECHNIQUE: Multidetector CT imaging of the lower left extremity through the tibia in the left ankle was performed according to the standard protocol. COMPARISON:  None. FINDINGS: Bones/Joint/Cartilage Nondisplaced, mildly comminuted, slightly oblique fracture at the junction of the middle and distal third of the left tibial diaphysis. Fibula is intact. Ankle mortise is intact. Visualized bones of the ankle, hindfoot and midfoot appear intact. Ligaments Suboptimally assessed by CT.  Muscles and Tendons Suboptimally assessed by noncontrast CT. Soft tissues Soft tissue swelling surrounding the nondisplaced fracture of the tibial diaphysis. Multiple small locules of gas in the adjacent musculature and soft tissues, suggestive of either penetrating wound or open fracture. IMPRESSION: 1. Nondisplaced, mildly comminuted fracture of the left tibial diaphysis at the junction of middle and distal third with overlying soft tissue swelling and multiple small locules of gas in the adjacent soft tissues suggesting either a penetrating wound or open fracture. 2. No evidence of significant acute traumatic injury to the left ankle. Electronically Signed   By: Trudie Reed M.D.   On: 07/14/2021 05:32   CT ANKLE LEFT WO CONTRAST  Result Date: 07/14/2021 CLINICAL DATA:  25 year old male with history of trauma to the left lower extremity after being struck by a forklift. EXAM: CT OF THE LOWER LEFT EXTREMITY WITHOUT CONTRAST TECHNIQUE: Multidetector CT imaging of the lower left extremity through the tibia in the left ankle was performed according to the standard protocol. COMPARISON:  None. FINDINGS: Bones/Joint/Cartilage Nondisplaced, mildly comminuted, slightly oblique fracture at the junction of the middle and distal third of the left tibial diaphysis. Fibula is intact. Ankle mortise is intact. Visualized bones of the ankle, hindfoot and midfoot appear intact. Ligaments Suboptimally assessed by CT. Muscles and Tendons Suboptimally assessed by noncontrast CT. Soft tissues Soft tissue swelling surrounding the nondisplaced fracture of the tibial diaphysis. Multiple small locules of gas in the adjacent musculature and soft tissues, suggestive of either penetrating wound or open fracture. IMPRESSION: 1. Nondisplaced, mildly comminuted fracture of the left tibial diaphysis at the junction of middle and distal third with overlying soft tissue swelling and multiple small locules of gas in the adjacent soft  tissues suggesting either a penetrating wound or open fracture. 2. No evidence of significant acute traumatic injury to the left ankle. Electronically Signed   By: Trudie Reed M.D.   On: 07/14/2021 05:32    Review of Systems  HENT:  Negative for ear discharge, ear pain, hearing loss and tinnitus.   Eyes:  Negative for photophobia and pain.  Respiratory:  Negative for cough and shortness of breath.   Cardiovascular:  Negative for chest pain.  Gastrointestinal:  Negative for abdominal pain, nausea and vomiting.  Genitourinary:  Negative for dysuria, flank pain, frequency and urgency.  Musculoskeletal:  Positive for arthralgias (Left lower leg). Negative for back pain, myalgias and neck pain.  Neurological:  Negative for dizziness and headaches.  Hematological:  Does not bruise/bleed easily.  Psychiatric/Behavioral:  The patient is not nervous/anxious.   Blood pressure  108/72, pulse 64, temperature 98.8 F (37.1 C), temperature source Oral, resp. rate 14, SpO2 100 %. Physical Exam Constitutional:      General: He is not in acute distress.    Appearance: He is well-developed. He is not diaphoretic.  HENT:     Head: Normocephalic and atraumatic.  Eyes:     General: No scleral icterus.       Right eye: No discharge.        Left eye: No discharge.     Conjunctiva/sclera: Conjunctivae normal.  Cardiovascular:     Rate and Rhythm: Normal rate and regular rhythm.  Pulmonary:     Effort: Pulmonary effort is normal. No respiratory distress.  Musculoskeletal:     Cervical back: Normal range of motion.     Comments: LLE Smallish soft tissue defect lateral lower leg, no ecchymosis or rash, compartments soft  Mod TTP  No knee or ankle effusion  Sens DPN, SPN, TN intact  Motor EHL, ext, flex, evers 5/5  DP 2+, PT 0, No significant edema  Skin:    General: Skin is warm and dry.  Neurological:     Mental Status: He is alert.  Psychiatric:        Mood and Affect: Mood normal.         Behavior: Behavior normal.    Assessment/Plan: Left open tibia fracture -- Plan IMN today, probably by Dr. Jena Gauss. Please keep NPO.    Freeman Caldron, PA-C Orthopedic Surgery [phone] 07/14/2021, 8:20 AM

## 2021-07-14 NOTE — ED Provider Notes (Signed)
[facility] EMERGENCY DEPARTMENT Provider Note   CSN: [id number] Arrival date & time: 07/14/21  0048     History Chief Complaint  Patient presents with   Leg Injury    L    Jock Mahon is a 25 y.o. male.  The history is provided by the patient.  Leg Pain Location:  Leg Time since incident:  5 hours Injury: yes   Mechanism of injury comment:  Hit in the lateral aspect of the LLE with a forklift Leg location:  L lower leg Pain details:    Quality:  Aching   Radiates to:  Does not radiate   Severity:  Severe   Onset quality:  Sudden   Duration:  5 hours   Timing:  Constant   Progression:  Unchanged Chronicity:  New Dislocation: no   Foreign body present:  No foreign bodies Tetanus status:  Unknown Prior injury to area:  No Relieved by:  Nothing Worsened by:  Nothing Ineffective treatments:  None tried Associated symptoms: muscle weakness   Associated symptoms: no back pain, no fever and no neck pain   Associated symptoms comment:  Laceration 3 cm gaping lateral L lower shin  Risk factors: no concern for non-accidental trauma       Past Medical History:  Diagnosis Date   Seasonal allergies     There are no problems to display for this patient.   Past Surgical History:  Procedure Laterality Date   ADENOIDECTOMY     MYRINGOTOMY     TYMPANOPLASTY Left 04/08/2020   Procedure: LEFT TYMPANOPLASTY;  Surgeon: [name], MD;  Location: [facility];  Service: ENT;  Laterality: Left;       History reviewed. No pertinent family history.  Social History   Tobacco Use   Smoking status: Never   Smokeless tobacco: Never  Vaping Use   Vaping Use: Never used  Substance Use Topics   Alcohol use: Never   Drug use: Never    Home Medications Prior to Admission medications   Medication Sig Start Date End Date Taking? Authorizing Provider  cetirizine (ZYRTEC) 10 MG tablet Take 10 mg by mouth daily.   Yes [provider]  montelukast (SINGULAIR) 10 MG tablet Take 10 mg by mouth at bedtime.   Yes [provider]  multivitamin (ONE-A-DAY MEN'S) TABS tablet Take 1 tablet by mouth daily.   Yes [provider]  vitamin C (ASCORBIC ACID) 500 MG tablet Take 500 mg by mouth daily.   Yes [provider]  ciprofloxacin-dexamethasone (CIPRODEX) OTIC suspension Place 3 drops into the left ear 3 (three) times daily. Patient not taking: No sig reported 04/08/20   [name], MD  HYDROcodone-acetaminophen (NORCO) 7.5-325 MG tablet Take 1 tablet by mouth every 6 (six) hours as needed for moderate pain. Patient not taking: No sig reported 04/08/20   [name], MD  promethazine (PHENERGAN) 25 MG suppository Place 1 suppository (25 mg total) rectally every 6 (six) hours as needed for nausea or vomiting. Patient not taking: No sig reported 04/08/20   [name], MD    Allergies    Patient has no known allergies.  Review of Systems   Review of Systems  Constitutional:  Negative for fever.  HENT:  Negative for drooling.   Eyes:  Negative for redness.  Respiratory:  Negative for wheezing and stridor.   Cardiovascular:  Negative for palpitations.  Gastrointestinal:  Negative for vomiting.  Genitourinary:  Negative for difficulty urinating.  Musculoskeletal:  Positive for arthralgias. Negative for back pain and neck pain.  Neurological:  Negative for facial asymmetry.  Psychiatric/Behavioral:  Negative for agitation.   All other systems reviewed and are negative.  Physical Exam Updated Vital Signs BP 120/74   Pulse 86   Temp 98.8 F (37.1 C) (Oral)   Resp 20   SpO2 100%   Physical Exam Vitals and nursing note reviewed.  Constitutional:      General: He is not in acute distress.    Appearance: Normal appearance.  HENT:     Head: Normocephalic and atraumatic.     Nose: Nose normal.  Eyes:     Conjunctiva/sclera: Conjunctivae normal.     Pupils: Pupils are equal, round, and  reactive to light.  Cardiovascular:     Rate and Rhythm: Regular rhythm. Tachycardia present.     Pulses: Normal pulses.     Heart sounds: Normal heart sounds.  Pulmonary:     Effort: Pulmonary effort is normal.     Breath sounds: Normal breath sounds.  Abdominal:     General: Abdomen is flat. Bowel sounds are normal.     Palpations: Abdomen is soft.     Tenderness: There is no abdominal tenderness. There is no guarding.  Musculoskeletal:     Cervical back: Normal range of motion and neck supple.     Right knee: Normal.     Left knee: Normal.     Left lower leg: Swelling, tenderness and bony tenderness present.     Right ankle: Normal.     Right Achilles Tendon: Normal.     Left ankle: Normal.     Left Achilles Tendon: Normal.     Left foot: Normal.       Legs:  Skin:    General: Skin is warm and dry.     Capillary Refill: Capillary refill takes less than 2 seconds.  Neurological:     General: No focal deficit present.     Mental Status: He is alert and oriented to person, place, and time.     Deep Tendon Reflexes: Reflexes normal.  Psychiatric:        Mood and Affect: Mood normal.        Behavior: Behavior normal.    ED Results / Procedures / Treatments   Labs (all labs ordered are listed, but only abnormal results are displayed) Results for orders placed or performed during the hospital encounter of 07/14/21  CBC with Differential/Platelet  Result Value Ref Range   WBC 11.5 (H) 4.0 - 10.5 K/uL   RBC 4.82 4.22 - 5.81 MIL/uL   Hemoglobin 14.9 13.0 - 17.0 g/dL   HCT 87.5 64.3 - 32.9 %   MCV 90.0 80.0 - 100.0 fL   MCH 30.9 26.0 - 34.0 pg   MCHC 34.3 30.0 - 36.0 g/dL   RDW 51.8 (L) 84.1 - 66.0 %   Platelets 208 150 - 400 K/uL   nRBC 0.0 0.0 - 0.2 %   Neutrophils Relative % 85 %   Neutro Abs 9.7 (H) 1.7 - 7.7 K/uL   Lymphocytes Relative 7 %   Lymphs Abs 0.8 0.7 - 4.0 K/uL   Monocytes Relative 8 %   Monocytes Absolute 0.9 0.1 - 1.0 K/uL   Eosinophils Relative 0 %    Eosinophils Absolute 0.0 0.0 - 0.5 K/uL   Basophils Relative 0 %   Basophils Absolute 0.0 0.0 - 0.1 K/uL   Immature Granulocytes 0 %   Abs Immature  Granulocytes 0.03 0.00 - 0.07 K/uL  I-STAT, chem 8  Result Value Ref Range   Sodium 137 135 - 145 mmol/L   Potassium 4.1 3.5 - 5.1 mmol/L   Chloride 102 98 - 111 mmol/L   BUN 17 6 - 20 mg/dL   Creatinine, Ser 7.51 0.61 - 1.24 mg/dL   Glucose, Bld 025 (H) 70 - 99 mg/dL   Calcium, Ion 8.52 7.78 - 1.40 mmol/L   TCO2 26 22 - 32 mmol/L   Hemoglobin 15.6 13.0 - 17.0 g/dL   HCT 24.2 35.3 - 61.4 %   DG Ankle Complete Left  Result Date: 07/14/2021 CLINICAL DATA:  Status post trauma. EXAM: LEFT ANKLE COMPLETE - 3+ VIEW COMPARISON:  None. FINDINGS: An acute, nondisplaced fracture is seen involving the mid shaft of the left tibia. There is no evidence of dislocation. An ill-defined lateral soft tissue defect is seen at the level of the previously noted fracture site. A small amount of soft tissue air is also noted. IMPRESSION: Acute, nondisplaced fracture of the mid shaft of the left tibia. Electronically Signed   By: Aram Candela M.D.   On: 07/14/2021 01:34    Radiology DG Ankle Complete Left  Result Date: 07/14/2021 CLINICAL DATA:  Status post trauma. EXAM: LEFT ANKLE COMPLETE - 3+ VIEW COMPARISON:  None. FINDINGS: An acute, nondisplaced fracture is seen involving the mid shaft of the left tibia. There is no evidence of dislocation. An ill-defined lateral soft tissue defect is seen at the level of the previously noted fracture site. A small amount of soft tissue air is also noted. IMPRESSION: Acute, nondisplaced fracture of the mid shaft of the left tibia. Electronically Signed   By: Aram Candela M.D.   On: 07/14/2021 01:34    Procedures Procedures   Medications Ordered in ED Medications  ceFAZolin (ANCEF) IVPB 2g/100 mL premix (2 g Intravenous New Bag/Given 07/14/21 0500)  morphine 4 MG/ML injection 4 mg (has no administration in time  range)  sodium chloride 0.9 % bolus 500 mL (has no administration in time range)  Tdap (BOOSTRIX) injection 0.5 mL (0.5 mLs Intramuscular Given 07/14/21 0114)  ibuprofen (ADVIL) tablet 800 mg (800 mg Oral Given 07/14/21 0114)  sodium chloride 0.9 % bolus 500 mL (500 mLs Intravenous New Bag/Given 07/14/21 0456)  fentaNYL (SUBLIMAZE) injection 50 mcg (50 mcg Intravenous Given 07/14/21 0455)  ketorolac (TORADOL) 30 MG/ML injection 15 mg (15 mg Intravenous Given 07/14/21 0456)    ED Course  I have reviewed the triage vital signs and the nursing notes.  Pertinent labs & imaging results that were available during my care of the patient were reviewed by me and considered in my medical decision making (see chart for details).   Compartments are soft at this time.  620 am   609 am: case d/w Dr. Charlann Boxer.  [place] to see this am, NPO for wash out    Donavon Kimrey was evaluated in Emergency Department on 07/14/2021 for the symptoms described in the history of present illness. He was evaluated in the context of the global COVID-19 pandemic, which necessitated consideration that the patient might be at risk for infection with the SARS-CoV-2 virus that causes COVID-19. Institutional protocols and algorithms that pertain to the evaluation of patients at risk for COVID-19 are in a state of rapid change based on information released by regulatory bodies including the CDC and federal and state organizations. These policies and algorithms were followed during the patient's care in the ED.  Final Clinical Impression(s) / ED Diagnoses Final diagnoses:  None       Sharhonda Atwood, MD 07/14/21 6314

## 2021-07-14 NOTE — ED Provider Notes (Signed)
Emergency Medicine Provider Triage Evaluation Note  Art Levan , a 25 y.o. male  was evaluated in triage.  Pt complains of LLE pain and laceration after being struck with a forklift. Incident occurred just PTA when at work. No medications PTA. Bleeding controlled with pressure dressing applied by patient. Tdap status unknown.  Review of Systems  Positive: LLE pain, laceration Negative: Numbness, weakness  Physical Exam  BP (!) 144/122 (BP Location: Left Arm)   Pulse (!) 123   Temp 98.8 F (37.1 C) (Oral)   Resp 16   SpO2 99%  Gen:   Awake, no distress   Resp:  Normal effort  MSK:   Moves extremities without difficulty  Other:  3cm laceration to lateral LLE; bleeding controlled. Associated swelling of the L proximal ankle.  Medical Decision Making  Medically screening exam initiated at 12:58 AM.  Appropriate orders placed.  Frutoso Dimare was informed that the remainder of the evaluation will be completed by another provider, this initial triage assessment does not replace that evaluation, and the importance of remaining in the ED until their evaluation is complete.  LLE laceration Xray ordered to r/o associated fx given trauma   Antony Madura, PA-C 07/14/21 0104    Tilden Fossa, MD 07/14/21 [phone]

## 2021-07-14 NOTE — Transfer of Care (Signed)
Immediate Anesthesia Transfer of Care Note  Patient: Zacheriah Stumpe  Procedure(s) Performed: IRRIGATION AND DEBRIDEMENT EXTREMITY (Left: Leg Lower) INTRAMEDULLARY (IM) NAIL TIBIAL (Left: Leg Lower)  Patient Location: PACU  Anesthesia Type:General  Level of Consciousness: drowsy, patient cooperative and responds to stimulation  Airway & Oxygen Therapy: Patient Spontanous Breathing and Patient connected to nasal cannula oxygen  Post-op Assessment: Report given to RN, Post -op Vital signs reviewed and stable and Patient moving all extremities X 4  Post vital signs: Reviewed and stable  Last Vitals:  Vitals Value Taken Time  BP    Temp    Pulse 107 07/14/21 1316  Resp 11 07/14/21 1316  SpO2 98 % 07/14/21 1316  Vitals shown include unvalidated device data.  Last Pain:  Vitals:   07/14/21 1001  TempSrc:   PainSc: 6       Patients Stated Pain Goal: 3 (07/14/21 1001)  Complications: No notable events documented.

## 2021-07-15 ENCOUNTER — Encounter (HOSPITAL_COMMUNITY): Payer: Self-pay | Admitting: Orthopedic Surgery

## 2021-07-15 ENCOUNTER — Other Ambulatory Visit (HOSPITAL_COMMUNITY): Payer: Self-pay

## 2021-07-15 DIAGNOSIS — E559 Vitamin D deficiency, unspecified: Secondary | ICD-10-CM | POA: Diagnosis present

## 2021-07-15 HISTORY — DX: Vitamin D deficiency, unspecified: E55.9

## 2021-07-15 LAB — BASIC METABOLIC PANEL
Anion gap: 7 (ref 5–15)
BUN: 13 mg/dL (ref 6–20)
CO2: 26 mmol/L (ref 22–32)
Calcium: 8.6 mg/dL — ABNORMAL LOW (ref 8.9–10.3)
Chloride: 101 mmol/L (ref 98–111)
Creatinine, Ser: 1.11 mg/dL (ref 0.61–1.24)
GFR, Estimated: >60 mL/min (ref >60)
Glucose, Bld: 144 mg/dL — ABNORMAL HIGH (ref 70–99)
Potassium: 4.3 mmol/L (ref 3.5–5.1)
Sodium: 134 mmol/L — ABNORMAL LOW (ref 135–145)

## 2021-07-15 LAB — VITAMIN D 25 HYDROXY (VIT D DEFICIENCY, FRACTURES): Vit D, 25-Hydroxy: 25.53 ng/mL — ABNORMAL LOW (ref 30–100)

## 2021-07-15 LAB — CBC
HCT: 36.9 % — ABNORMAL LOW (ref 39.0–52.0)
Hemoglobin: 12.5 g/dL — ABNORMAL LOW (ref 13.0–17.0)
MCH: 30.9 pg (ref 26.0–34.0)
MCHC: 33.9 g/dL (ref 30.0–36.0)
MCV: 91.1 fL (ref 80.0–100.0)
Platelets: 171 10*3/uL (ref 150–400)
RBC: 4.05 MIL/uL — ABNORMAL LOW (ref 4.22–5.81)
RDW: 10.9 % — ABNORMAL LOW (ref 11.5–15.5)
WBC: 8.5 10*3/uL (ref 4.0–10.5)
nRBC: 0 % (ref 0.0–0.2)

## 2021-07-15 MED ORDER — DOCUSATE SODIUM 100 MG PO CAPS
100.0000 mg | ORAL_CAPSULE | Freq: Every day | ORAL | 0 refills | Status: DC
  Filled 2021-07-15: qty 20, 20d supply, fill #0

## 2021-07-15 MED ORDER — OXYCODONE-ACETAMINOPHEN 5-325 MG PO TABS
1.0000 | ORAL_TABLET | Freq: Four times a day (QID) | ORAL | 0 refills | Status: DC | PRN
  Filled 2021-07-15: qty 50, 7d supply, fill #0

## 2021-07-15 MED ORDER — CHOLECALCIFEROL 125 MCG (5000 UT) PO TABS
5000.0000 [IU] | ORAL_TABLET | Freq: Every day | ORAL | 6 refills | Status: DC
  Filled 2021-07-15: qty 30, 30d supply, fill #0

## 2021-07-15 MED ORDER — METHOCARBAMOL 500 MG PO TABS
500.0000 mg | ORAL_TABLET | Freq: Three times a day (TID) | ORAL | 0 refills | Status: DC | PRN
  Filled 2021-07-15: qty 60, 10d supply, fill #0

## 2021-07-15 MED ORDER — RIVAROXABAN 15 MG PO TABS
15.0000 mg | ORAL_TABLET | Freq: Every day | ORAL | 0 refills | Status: DC
  Filled 2021-07-15: qty 30, 30d supply, fill #0

## 2021-07-15 MED ORDER — ACETAMINOPHEN 500 MG PO TABS
500.0000 mg | ORAL_TABLET | Freq: Two times a day (BID) | ORAL | 0 refills | Status: AC
  Filled 2021-07-15: qty 60, 30d supply, fill #0

## 2021-07-15 MED ORDER — KETOROLAC TROMETHAMINE 10 MG PO TABS
10.0000 mg | ORAL_TABLET | Freq: Four times a day (QID) | ORAL | 0 refills | Status: DC | PRN
  Filled 2021-07-15: qty 16, 4d supply, fill #0

## 2021-07-15 NOTE — Plan of Care (Addendum)
Patient is alert and oriented x 4 and states that his pain is minimal at this time. When assessing the pt's left leg, it came to the RN's attention that there was some old/new drainage. Pt's dressing was removed and the incision site was assessed at 0824. Incision was intact. Pt's leg was then redressed with guaze, ADB pad and an ACE compression rap.   2409:  PA Montez Morita was informed of the measures taken and RN was informed that he would stop by to take a look at it when he makes his rounds.   Problem: Education: Goal: Knowledge of General Education information will improve Description: Including pain rating scale, medication(s)/side effects and non-pharmacologic comfort measures Outcome: Progressing   Problem: Health Behavior/Discharge Planning: Goal: Ability to manage health-related needs will improve Outcome: Progressing   Problem: Clinical Measurements: Goal: Ability to maintain clinical measurements within normal limits will improve Outcome: Progressing Goal: Will remain free from infection Outcome: Progressing Goal: Diagnostic test results will improve Outcome: Progressing Goal: Respiratory complications will improve Outcome: Progressing Goal: Cardiovascular complication will be avoided Outcome: Progressing   Problem: Activity: Goal: Risk for activity intolerance will decrease Outcome: Progressing   Problem: Nutrition: Goal: Adequate nutrition will be maintained Outcome: Progressing   Problem: Coping: Goal: Level of anxiety will decrease Outcome: Progressing   Problem: Elimination: Goal: Will not experience complications related to bowel motility Outcome: Progressing Goal: Will not experience complications related to urinary retention Outcome: Progressing   Problem: Pain Managment: Goal: General experience of comfort will improve Outcome: Progressing   Problem: Safety: Goal: Ability to remain free from injury will improve Outcome: Progressing   Problem:  Skin Integrity: Goal: Risk for impaired skin integrity will decrease Outcome: Progressing

## 2021-07-15 NOTE — Evaluation (Signed)
Physical Therapy Evaluation Patient Details Name: Christopher Pittman MRN: [id number] DOB: [birth year] Today's Date: 07/15/2021  History of Present Illness  Pt is pleasant 25 y/o M admitted 07/14/21 with LLE pain and found to have L tibial fracture. Pt is s/p intramedullary nailing left tibia 07/14/21.  Clinical Impression  Patient evaluated by Physical Therapy with no further acute PT needs identified. All education has been completed and the patient has no further questions. Managing crutches and stairs guardedly, but well; Questions solicited and answered;  See below for any follow-up Physical Therapy or equipment needs. PT is signing off. Thank you for this referral.        Recommendations for follow up therapy are one component of a multi-disciplinary discharge planning process, led by the attending physician.  Recommendations may be updated based on patient status, additional functional criteria and insurance authorization.  Follow Up Recommendations Outpatient PT (The potential need for Outpatient PT can be addressed at Ortho follow-up appointments. )    Equipment Recommendations  Crutches;3in1 (PT)    Recommendations for Other Services       Precautions / Restrictions Precautions Precautions: None Restrictions Weight Bearing Restrictions: Yes LLE Weight Bearing: Weight bearing as tolerated      Mobility  Bed Mobility Overal bed mobility: Independent                  Transfers Overall transfer level: Needs assistance Equipment used: Crutches Transfers: Sit to/from Stand Sit to Stand: Min guard;Supervision         General transfer comment: verbal and demo cues for crutch management  Ambulation/Gait Ambulation/Gait assistance: Min Gaffer (Feet): 80 Feet Assistive device: Crutches Gait Pattern/deviations: Step-to pattern;Step-through pattern Gait velocity: slow   General Gait Details: Demo cues for gait sequencing; Adjusted crutch  height and handle height for optimal fit; initiatlly with short steps, but progressed to step through with practice; able to touchdown L foot and begin shifting weight onto it  Stairs Stairs: Yes Stairs assistance: Min guard Stair Management: One rail Right;With crutches;Forwards;Step to pattern Number of Stairs: 10 General stair comments: Demo cues at first, then pt practiced stairs with rail on R; smooth stable steps  Wheelchair Mobility    Modified Rankin (Stroke Patients Only)       Balance Overall balance assessment: Needs assistance Sitting-balance support: No upper extremity supported;Feet supported Sitting balance-Leahy Scale: Good     Standing balance support: Bilateral upper extremity supported Standing balance-Leahy Scale: Fair                               Pertinent Vitals/Pain Pain Assessment: 0-10 Pain Score: 8  Pain Location: LLE Pain Descriptors / Indicators: Aching;Sore Pain Intervention(s): Monitored during session    Home Living Family/patient expects to be discharged to:: Private residence Living Arrangements: Parent Available Help at Discharge: Family;Friend(s);Available 24 hours/day Type of Home: House Home Access: Stairs to enter Entrance Stairs-Rails: None Entrance Stairs-Number of Steps: 1 Home Layout: Two level;Bed/bath upstairs Home Equipment: Grab bars - tub/shower      Prior Function Level of Independence: Independent               Hand Dominance        Extremity/Trunk Assessment   Upper Extremity Assessment Upper Extremity Assessment: Overall WFL for tasks assessed    Lower Extremity Assessment Lower Extremity Assessment: LLE deficits/detail LLE Deficits / Details: decr AROM adn strength, limited by pain post injury and  surgical fixation    Cervical / Trunk Assessment Cervical / Trunk Assessment: Normal  Communication   Communication: No difficulties  Cognition Arousal/Alertness: Awake/alert Behavior  During Therapy: WFL for tasks assessed/performed Overall Cognitive Status: Within Functional Limits for tasks assessed                                        General Comments General comments (skin integrity, edema, etc.): Discussed typical gait and crutch use progression; discussed therex with pt and parents    Exercises     Assessment/Plan    PT Assessment All further PT needs can be met in the next venue of care  PT Problem List Decreased strength;Decreased range of motion;Decreased activity tolerance;Decreased balance;Pain       PT Treatment Interventions      PT Goals (Current goals can be found in the Care Plan section)  Acute Rehab PT Goals Patient Stated Goal: return home PT Goal Formulation: All assessment and education complete, DC therapy    Frequency     Barriers to discharge        Co-evaluation               AM-PAC PT "6 Clicks" Mobility  Outcome Measure Help needed turning from your back to your side while in a flat bed without using bedrails?: None Help needed moving from lying on your back to sitting on the side of a flat bed without using bedrails?: None Help needed moving to and from a bed to a chair (including a wheelchair)?: None Help needed standing up from a chair using your arms (e.g., wheelchair or bedside chair)?: A Little Help needed to walk in hospital room?: A Little Help needed climbing 3-5 steps with a railing? : A Little 6 Click Score: 21    End of Session Equipment Utilized During Treatment: Gait belt Activity Tolerance: Patient tolerated treatment well Patient left: in chair;with call bell/phone within reach;with family/visitor present Nurse Communication: Mobility status (OK for dc today) PT Visit Diagnosis: Other abnormalities of gait and mobility (R26.89);Pain Pain - Right/Left: Left Pain - part of body: Leg    Time: 7078-6754 PT Time Calculation (min) (ACUTE ONLY): 56 min   Charges:   PT  Evaluation $PT Eval Low Complexity: 1 Low PT Treatments $Gait Training: 23-37 mins $Therapeutic Activity: 8-22 mins        Roney Marion, PT  Acute Rehabilitation Services Pager [phone] Office [phone]   Colletta [place] 07/15/2021, 2:51 PM

## 2021-07-15 NOTE — Discharge Summary (Signed)
Orthopaedic Trauma Service (OTS) Discharge Summary   Patient ID: Christopher Pittman MRN: [id number] DOB/AGE: [birth year] 25 y.o.  Admit date: 07/14/2021 Discharge date: 07/15/2021  Admission Diagnoses: Work-related injury Open left tibia fracture Complex wound left leg   Discharge Diagnoses:  Principal Problem:   Open left tibial fracture Active Problems:   Vitamin D insufficiency   Past Medical History:  Diagnosis Date   Open fracture of left tibia and fibula, type I or II, initial encounter 07/14/2021   Seasonal allergies    Vitamin D insufficiency 07/15/2021     Procedures Performed: 07/14/2021-Dr. 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  Patient was run over by a forklift.  He sustained a traumatic wound to his left lower leg and also sustained a left tibia fracture.  He was brought to [facility] for evaluation found to have isolated orthopedic injuries.  Ortho trauma service consulted given the complexity of the injury.  Patient was taken to the operating room on 07/14/2021 for the procedures noted above.  Patient tolerated the procedures well.  No complications or issues were noted during surgery.  Patient transferred to the PACU for recovery from anesthesia and then transferred back to the orthopedic floor for observation, pain control therapies and IV antibiotics.  Patient remained hospitalized for IV antibiotics given his complex open wound.  On postoperative day 1 he was doing wonderfully pain is well controlled pretty much with just Tylenol in the postoperative setting.  He was started on Lovenox for DVT and PE prophylaxis.  He worked with therapies without any issues and was deemed stable for discharge to home on postoperative day #1.  Patient is weightbearing as  tolerated on his left leg with the use of crutches.  He is unrestricted range of motion of his knee and ankle.  Wound care was reviewed with the patient and his significant other.  He will be on 15 mg daily for the next 30 days for DVT PE prophylaxis  Time of discharge patient tolerating regular diet and voiding without difficulty.  He is passing gas.  No other issues of note.  Consults: None  Significant Diagnostic Studies: labs:   Results for MARCK, MCCLENNY (MRN [id number]) as of 07/15/2021 09:53  Ref. Range 07/14/2021 16:48 07/15/2021 01:28  Sodium Latest Ref Range: 135 - 145 mmol/L  134 (L)  Potassium Latest Ref Range: 3.5 - 5.1 mmol/L  4.3  Chloride Latest Ref Range: 98 - 111 mmol/L  101  CO2 Latest Ref Range: 22 - 32 mmol/L  26  Glucose Latest Ref Range: 70 - 99 mg/dL  756 (H)  BUN Latest Ref Range: 6 - 20 mg/dL  13  Creatinine Latest Ref Range: 0.61 - 1.24 mg/dL 4.33 2.95  Calcium Latest Ref Range: 8.9 - 10.3 mg/dL  8.6 (L)  Anion gap Latest Ref Range: 5 - 15   7  GFR, Estimated Latest Ref Range: >60 mL/min >60 >60  Vitamin D, 25-Hydroxy Latest Ref Range: 30 - 100 ng/mL  25.53 (L)  WBC Latest Ref Range: 4.0 - 10.5 K/uL 6.1 8.5  RBC Latest Ref Range: 4.22 - 5.81 MIL/uL 4.27 4.05 (L)  Hemoglobin Latest Ref Range: 13.0 - 17.0 g/dL 18.8 41.6 (L)  HCT Latest Ref Range: 39.0 - 52.0 % 39.0 36.9 (L)  MCV Latest Ref Range: 80.0 - 100.0  fL 91.3 91.1  MCH Latest Ref Range: 26.0 - 34.0 pg 30.4 30.9  MCHC Latest Ref Range: 30.0 - 36.0 g/dL 41.7 40.8  RDW Latest Ref Range: 11.5 - 15.5 % 11.3 (L) 10.9 (L)  Platelets Latest Ref Range: 150 - 400 K/uL 188 171  nRBC Latest Ref Range: 0.0 - 0.2 % 0.0 0.0    Treatments: IV hydration, antibiotics: Ancef, analgesia: acetaminophen and oxycodone, anticoagulation: LMW heparin and Xarelto at discharge, therapies: PT, OT, and RN, and surgery: As above  Discharge Exam:                                Orthopaedic Trauma Service Progress Note   Patient  ID: Iden Christopher Pittman MRN: [id number] DOB/AGE: 24-Jul-1996 25 y.o.   Subjective:   Doing very well this morning.  Pain is well controlled.  Would like to go home today Awaiting therapy Very eager and motivated   Dressing changed by nursing staff this morning due to some bleeding at the knee     Review of Systems  Constitutional:  Negative for chills and fever.  HENT:  Negative for sore throat.   Respiratory:  Negative for shortness of breath and wheezing.   Cardiovascular:  Negative for chest pain and palpitations.  Gastrointestinal:  Negative for abdominal pain, nausea and vomiting.  Genitourinary:  Negative for dysuria.  Neurological:  Negative for dizziness, tingling and sensory change.    Objective:    VITALS:         Vitals:    07/14/21 1534 07/14/21 2100 07/15/21 0500 07/15/21 0822  BP: 122/74 115/79 119/63 109/64  Pulse: 100 78 61 63  Resp: 14 16 16 18   Temp: 98.5 F (36.9 C) 99.2 F (37.3 C) 98.8 F (37.1 C) 98 F (36.7 C)  TempSrc: Oral Oral Oral Oral  SpO2: 100% 100% 100% 100%  Weight:          Height:              Estimated body mass index is 23.57 kg/m as calculated from the following:   Height as of this encounter: 5\' 8"  (1.727 m).   Weight as of this encounter: 70.3 kg.     Intake/Output      10/03 0701 10/04 0700 10/04 0701 10/05 0700   P.O. 400    I.V. (mL/kg) 1000 (14.2)    IV Piggyback     Total Intake(mL/kg) 1400 (19.9)    Urine (mL/kg/hr) 600 (0.4)    Emesis/NG output 0    Stool 0    Blood 100    Total Output 700    Net +700         Urine Occurrence 1 x    Stool Occurrence 0 x    Emesis Occurrence 0 x       LABS   Lab Results Last 24 Hours       Results for orders placed or performed during the hospital encounter of 07/14/21 (from the past 24 hour(s))  CBC     Status: Abnormal    Collection Time: 07/14/21  4:48 PM  Result Value Ref Range    WBC 6.1 4.0 - 10.5 K/uL    RBC 4.27 4.22 - 5.81 MIL/uL    Hemoglobin 13.0 13.0 - 17.0  g/dL    HCT 14.9 70.2 - 63.7 %    MCV 91.3 80.0 - 100.0 fL    MCH 30.4 26.0 -  34.0 pg    MCHC 33.3 30.0 - 36.0 g/dL    RDW 12.4 (L) 58.0 - 15.5 %    Platelets 188 150 - 400 K/uL    nRBC 0.0 0.0 - 0.2 %  Creatinine, serum     Status: None    Collection Time: 07/14/21  4:48 PM  Result Value Ref Range    Creatinine, Ser 1.02 0.61 - 1.24 mg/dL    GFR, Estimated >99 >83 mL/min  VITAMIN D 25 Hydroxy (Vit-D Deficiency, Fractures)     Status: Abnormal    Collection Time: 07/15/21  1:28 AM  Result Value Ref Range    Vit D, 25-Hydroxy 25.53 (L) 30 - 100 ng/mL  Basic metabolic panel     Status: Abnormal    Collection Time: 07/15/21  1:28 AM  Result Value Ref Range    Sodium 134 (L) 135 - 145 mmol/L    Potassium 4.3 3.5 - 5.1 mmol/L    Chloride 101 98 - 111 mmol/L    CO2 26 22 - 32 mmol/L    Glucose, Bld 144 (H) 70 - 99 mg/dL    BUN 13 6 - 20 mg/dL    Creatinine, Ser 3.82 0.61 - 1.24 mg/dL    Calcium 8.6 (L) 8.9 - 10.3 mg/dL    GFR, Estimated >50 >53 mL/min    Anion gap 7 5 - 15  CBC     Status: Abnormal    Collection Time: 07/15/21  1:28 AM  Result Value Ref Range    WBC 8.5 4.0 - 10.5 K/uL    RBC 4.05 (L) 4.22 - 5.81 MIL/uL    Hemoglobin 12.5 (L) 13.0 - 17.0 g/dL    HCT 97.6 (L) 73.4 - 52.0 %    MCV 91.1 80.0 - 100.0 fL    MCH 30.9 26.0 - 34.0 pg    MCHC 33.9 30.0 - 36.0 g/dL    RDW 19.3 (L) 79.0 - 15.5 %    Platelets 171 150 - 400 K/uL    nRBC 0.0 0.0 - 0.2 %          PHYSICAL EXAM:    Gen: Resting comfortably in bed, no acute distress, very pleasant and polite.  Appears well Lungs: Unlabored, clear bilaterally Cardiac: Regular rate and rhythm, S1 and S2 Abd: Soft, nontender, nondistended,  + bowel sounds Ext:       Left lower extremity             Proximal dressings removed, no active drainage.  Incision clean and intact.                         Dressing reapplied to the knee and proximal lower leg             Swelling is well controlled             DPN, SPN, TN  sensory functions intact             EHL, FHL, lesser toe motor functions intact.  Ankle flexion, extension, inversion and eversion intact             No pain out of proportion with passive stretching of toes or ankle             No DCT             + DP pulse             Excellent knee and ankle  range of motion                Assessment/Plan: 1 Day Post-Op        Anti-infectives (From admission, onward)        Start     Dose/Rate Route Frequency Ordered Stop    07/14/21 1830   ceFAZolin (ANCEF) IVPB 2g/100 mL premix        2 g 200 mL/hr over 30 Minutes Intravenous Every 8 hours 07/14/21 1621 07/15/21 1829    07/14/21 1015   ceFAZolin (ANCEF) IVPB 2g/100 mL premix        2 g 200 mL/hr over 30 Minutes Intravenous  Once 07/14/21 1007 07/14/21 1116    07/14/21 1009   ceFAZolin (ANCEF) 2-4 GM/100ML-% IVPB       Note to Pharmacy: Gleason, Ginger   : cabinet override         07/14/21 1009 07/14/21 1120    07/14/21 0445   ceFAZolin (ANCEF) IVPB 2g/100 mL premix        2 g 200 mL/hr over 30 Minutes Intravenous  Once 07/14/21 0436 07/14/21 0533    07/14/21 0000   cephALEXin (KEFLEX) 500 MG capsule  Status:  Discontinued        500 mg Oral 4 times daily 07/14/21 0523 07/14/21          .   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  Reviewed wound care with patient.  Reinforce dressing as needed versus removing               Ice and elevate for swelling and pain control               Encouraged to wear regular shoe when mobilizing and treat left leg like leg     PT- please teach HEP for left knee ROM- AROM,  PROM. Prone exercises as well. No ROM restrictions.  Quad sets, SLR, LAQ, SAQ, heel slides, stretching, prone flexion and extension   Ankle theraband program, heel cord stretching, toe towel curls, etc   No pillows under bend of knee when at rest, ok to place under heel to help work on extension. Can also use zero knee bone foam if available   -Complex wound left lateral leg s/p I&D             Wound care as above             Clean with soap and water only once dry.  No ointments lotions or solutions   - Pain management:             Multimodal                         Tylenol  Percocet                         Robaxin   - ABL anemia/Hemodynamics             Stable   - Medical issues              Vitamin D insufficiency                         Supplement   - DVT/PE prophylaxis:             Will place on Xarelto 15 mg daily x 4 weeks   - ID:              Last dose of Ancef due at 1030 today.  May discharge after therapy and receives last dose of IV antibiotics   - Metabolic Bone Disease:             Vitamin D insufficiency                         Supplement - Activity:             Weight-bear as tolerated with assistance---> crutches             Unrestricted range of motion left knee and ankle   - FEN/GI prophylaxis/Foley/Lines:             Regular diet             DC IV after IV antibiotics completed   - Impediments to fracture healing:             Complex wound left leg             Vitamin D insufficiency   - Dispo:             Therapy evaluations             DC home today after therapy and IV antibiotics             Will send medications to the [facility] pharmacy             Follow-up with orthopedics in 2 weeks    Disposition: Discharge disposition: 01-Home or Self Care      Discharge Instructions     Call MD / Call 911   Complete by: As directed    If you experience chest pain or shortness of breath, CALL 911 and be transported to the  hospital emergency room.  If you develope a fever above 101 F, pus (white drainage) or increased drainage or redness at the wound, or calf pain, call your surgeon's office.   Constipation Prevention   Complete by: As directed    Drink plenty of fluids.  Prune juice may be helpful.  You may use a stool softener, such as Colace (over the counter) 100 mg twice a day.  Use MiraLax (over the counter) for constipation as needed.   Diet general   Complete by: As directed    Discharge instructions   Complete by: As directed    Orthopaedic Trauma Service Discharge Instructions   General Discharge Instructions  Orthopaedic Injuries:  Left tibia fracture treated with intramedullary nailing            Complex wound left lower leg treated with irrigation debridement and closure  WEIGHT BEARING STATUS: Weight-bear as tolerated left leg, use  crutches to mobilize  RANGE OF MOTION/ACTIVITY: Unrestricted range of motion left knee and ankle.  Activity as tolerated.  Slowly increase activity level.  Would anticipate returning to work in 6 weeks.  Could possibly return sooner if sedentary work can be accommodated.  You would not be able to return using crutches  Bone health: Labs show vitamin D insufficiency.  Recommend supplementation with 5000 IUs of vitamin D3 daily.  Continue with your vitamin C supplementation  Wound Care: Daily wound care starting on 07/17/2021.  Please see below  Discharge Wound Care Instructions  Do NOT apply any ointments, solutions or lotions to pin sites or surgical wounds.  These prevent needed drainage and even though solutions like hydrogen peroxide kill bacteria, they also damage cells lining the pin sites that help fight infection.  Applying lotions or ointments can keep the wounds moist and can cause them to breakdown and open up as well. This can increase the risk for infection. When in doubt call the office.  Surgical incisions should be dressed daily.  If any drainage is  noted, use one layer of adaptic or mepitel, then gauze, Kerlix, and an ace wrap.  [email or web address]  These dressing supplies should be available at local medical supply stores ([employer], [employer], etc). They are not usually carried at places like [facility], [facility], [employer], etc   Once the incision is completely dry and without drainage, it may be left open to air out.  Showering may begin 36-48 hours later.  Cleaning gently with soap and water.  Traumatic wounds should be dressed daily as well.    One layer of adaptic, gauze, Kerlix, then ace wrap.  The adaptic can be discontinued once the draining has ceased    If you have a wet to dry dressing: wet the gauze with saline the squeeze as much saline out so the gauze is moist (not soaking wet), place moistened gauze over wound, then place a dry gauze over the moist one, followed by Kerlix wrap, then ace wrap.    DVT/PE prophylaxis: Xarelto 15 mg by mouth daily x 30 days  Diet: as you were eating previously.  Can use over the counter stool softeners and bowel preparations, such as Miralax, to help with bowel movements.  Narcotics can be constipating.  Be sure to drink plenty of fluids  PAIN MEDICATION USE AND EXPECTATIONS  You have likely been given narcotic medications to help control your pain.  After a traumatic event that results in an fracture (broken bone) with or without surgery, it is ok to use narcotic pain medications to help control one's pain.  We understand that everyone responds to pain differently and each individual patient will be evaluated on a regular basis for the continued need for narcotic medications. Ideally, narcotic medication use should last no more than 6-8 weeks (coinciding with fracture healing).   As a patient it is your responsibility  as well to monitor narcotic medication use and report the amount and frequency you use these medications when you come to your office visit.   We would also advise that if you are using narcotic medications, you should take a dose prior to therapy to maximize you participation.  IF YOU ARE ON NARCOTIC MEDICATIONS IT IS NOT PERMISSIBLE TO OPERATE A MOTOR VEHICLE (MOTORCYCLE/CAR/TRUCK/MOPED) OR HEAVY MACHINERY DO NOT MIX NARCOTICS WITH OTHER CNS (CENTRAL NERVOUS SYSTEM) DEPRESSANTS SUCH AS ALCOHOL   POST-OPERATIVE OPIOID TAPER INSTRUCTIONS:  It is important to wean off of your opioid  medication as soon as possible. If you do not need pain medication after your surgery it is ok to stop day one.  Opioids include:  o Codeine, Hydrocodone(Norco, Vicodin), Oxycodone(Percocet, oxycontin) and hydromorphone amongst others.   Long term and even short term use of opiods can cause:  o Increased pain response  o Dependence  o Constipation  o Depression  o Respiratory depression  o And more.   Withdrawal symptoms can include  o Flu like symptoms  o Nausea, vomiting  o And more  Techniques to manage these symptoms  o Hydrate well  o Eat regular healthy meals  o Stay active  o Use relaxation techniques(deep breathing, meditating, yoga)  Do Not substitute Alcohol to help with tapering  If you have been on opioids for less than two weeks and do not have pain than it is ok to stop all together.   Plan to wean off of opioids  o This plan should start within one week post op of your fracture surgery   o Maintain the same interval or time between taking each dose and first decrease the dose.   o Cut the total daily intake of opioids by one tablet each day  o Next start to increase the time between doses.  o The last dose that should be eliminated is the evening dose.    STOP SMOKING OR USING NICOTINE PRODUCTS!!!!  As discussed nicotine severely impairs your body's ability to heal surgical and  traumatic wounds but also impairs bone healing.  Wounds and bone heal by forming microscopic blood vessels (angiogenesis) and nicotine is a vasoconstrictor (essentially, shrinks blood vessels).  Therefore, if vasoconstriction occurs to these microscopic blood vessels they essentially disappear and are unable to deliver necessary nutrients to the healing tissue.  This is one modifiable factor that you can do to dramatically increase your chances of healing your injury.    (This means no smoking, no nicotine gum, patches, etc)  DO NOT USE NONSTEROIDAL ANTI-INFLAMMATORY DRUGS (NSAID'S)  Using products such as Advil (ibuprofen), Aleve (naproxen), Motrin (ibuprofen) for additional pain control during fracture healing can delay and/or prevent the healing response.  If you would like to take over the counter (OTC) medication, Tylenol (acetaminophen) is ok.  However, some narcotic medications that are given for pain control contain acetaminophen as well. Therefore, you should not exceed more than 4000 mg of tylenol in a day if you do not have liver disease.  Also note that there are may OTC medicines, such as cold medicines and allergy medicines that my contain tylenol as well.  If you have any questions about medications and/or interactions please ask your doctor/PA or your pharmacist.      ICE AND ELEVATE INJURED/OPERATIVE EXTREMITY  Using ice and elevating the injured extremity above your heart can help with swelling and pain control.  Icing in a pulsatile fashion, such as 20 minutes on and 20 minutes off, can be followed.    Do not place ice directly on skin. Make sure there is a barrier between to skin and the ice pack.    Using frozen items such as frozen peas works well as the conform nicely to the are that needs to be iced.  USE AN ACE WRAP OR TED HOSE FOR SWELLING CONTROL  In addition to icing and elevation, Ace wraps or TED hose are used to help limit and resolve swelling.  It is recommended to use  Ace wraps or TED hose until you are informed  to stop.    When using Ace Wraps start the wrapping distally (farthest away from the body) and wrap proximally (closer to the body)   Example: If you had surgery on your leg or thing and you do not have a splint on, start the ace wrap at the toes and work your way up to the thigh        If you had surgery on your upper extremity and do not have a splint on, start the ace wrap at your fingers and work your way up to the upper arm  IF YOU ARE IN A SPLINT OR CAST DO NOT REMOVE IT FOR ANY REASON   If your splint gets wet for any reason please contact the office immediately. You may shower in your splint or cast as long as you keep it dry.  This can be done by wrapping in a cast cover or garbage back (or similar)  Do Not stick any thing down your splint or cast such as pencils, money, or hangers to try and scratch yourself with.  If you feel itchy take benadryl as prescribed on the bottle for itching  IF YOU ARE IN A CAM BOOT (BLACK BOOT)  You may remove boot periodically. Perform daily dressing changes as noted below.  Wash the liner of the boot regularly and wear a sock when wearing the boot. It is recommended that you sleep in the boot until told otherwise    Call office for the following: ? Temperature greater than 101F ? Persistent nausea and vomiting ? Severe uncontrolled pain ? Redness, tenderness, or signs of infection (pain, swelling, redness, odor or green/yellow discharge around the site) ? Difficulty breathing, headache or visual disturbances ? Hives ? Persistent dizziness or light-headedness ? Extreme fatigue ? Any other questions or concerns you may have after discharge  In an emergency, call 911 or go to an Emergency Department at a nearby hospital  HELPFUL INFORMATION  ? If you had a block, it will wear off between 8-24 hrs postop typically.  This is period when your pain may go from nearly zero to the pain you would have had postop  without the block.  This is an abrupt transition but nothing dangerous is happening.  You may take an extra dose of narcotic when this happens.  ? You should wean off your narcotic medicines as soon as you are able.  Most patients will be off or using minimal narcotics before their first postop appointment.   ? We suggest you use the pain medication the first night prior to going to bed, in order to ease any pain when the anesthesia wears off. You should avoid taking pain medications on an empty stomach as it will make you nauseous.  ? Do not drink alcoholic beverages or take illicit drugs when taking pain medications.  ? In most states it is against the law to drive while you are in a splint or sling.  And certainly against the law to drive while taking narcotics.  ? You may return to work/school in the next couple of days when you feel up to it.   ? Pain medication may make you constipated.  Below are a few solutions to try in this order:   ? Decrease the amount of pain medication if you aren't having pain.   ? Drink lots of decaffeinated fluids.   ? Drink prune juice and/or each dried prunes   o If the first 3 don't work start with  additional solutions   ? Take Colace - an over-the-counter stool softener   ? Take Senokot - an over-the-counter laxative   ? Take Miralax - a stronger over-the-counter laxative     CALL THE OFFICE WITH ANY QUESTIONS OR CONCERNS: [phone]   VISIT OUR WEBSITE FOR ADDITIONAL INFORMATION: [email or web address]   Driving restrictions   Complete by: As directed    No driving until you are off narcotics   Increase activity slowly as tolerated   Complete by: As directed    Post-operative opioid taper instructions:   Complete by: As directed    POST-OPERATIVE OPIOID TAPER INSTRUCTIONS: It is important to wean off of your opioid medication as soon as possible. If you do not need pain medication after your surgery it is ok to stop day one. Opioids  include: Codeine, Hydrocodone(Norco, Vicodin), Oxycodone(Percocet, oxycontin) and hydromorphone amongst others.  Long term and even short term use of opiods can cause: Increased pain response Dependence Constipation Depression Respiratory depression And more.  Withdrawal symptoms can include Flu like symptoms Nausea, vomiting And more Techniques to manage these symptoms Hydrate well Eat regular healthy meals Stay active Use relaxation techniques(deep breathing, meditating, yoga) Do Not substitute Alcohol to help with tapering If you have been on opioids for less than two weeks and do not have pain than it is ok to stop all together.  Plan to wean off of opioids This plan should start within one week post op of your joint replacement. Maintain the same interval or time between taking each dose and first decrease the dose.  Cut the total daily intake of opioids by one tablet each day Next start to increase the time between doses. The last dose that should be eliminated is the evening dose.      Weight bearing as tolerated   Complete by: As directed    Laterality: left   Extremity: Lower      Allergies as of 07/15/2021   No Known Allergies      Medication List     TAKE these medications    acetaminophen 500 MG tablet Commonly known as: TYLENOL Take 1 tablet (500 mg total) by mouth every 12 (twelve) hours.   cetirizine 10 MG tablet Commonly known as: ZYRTEC Take 10 mg by mouth daily.   Cholecalciferol 125 MCG (5000 UT) Tabs Take 1 tablet (5,000 Units total) by mouth daily.   docusate sodium 100 MG capsule Commonly known as: COLACE Take 1 capsule (100 mg total) by mouth daily.   ketorolac 10 MG tablet Commonly known as: TORADOL Take 1 tablet (10 mg total) by mouth every 6 (six) hours as needed for moderate pain.   methocarbamol 500 MG tablet Commonly known as: ROBAXIN Take 1-2 tablets (500-1,000 mg total) by mouth every 8 (eight) hours as needed for muscle  spasms.   montelukast 10 MG tablet Commonly known as: SINGULAIR Take 10 mg by mouth at bedtime.   multivitamin Tabs tablet Take 1 tablet by mouth daily.   oxyCODONE-acetaminophen 5-325 MG tablet Commonly known as: Percocet Take 1-2 tablets by mouth every 6 (six) hours as needed for severe pain.   Rivaroxaban 15 MG Tabs tablet Commonly known as: XARELTO Take 1 tablet (15 mg total) by mouth daily with supper.   vitamin C 500 MG tablet Commonly known as: ASCORBIC ACID Take 500 mg by mouth daily.               Discharge Care Instructions  (From admission, onward)  Start     Ordered   07/15/21 0000  Weight bearing as tolerated       Question Answer Comment  Laterality left   Extremity Lower      07/15/21 1016            Follow-up Information     Myrene Galas, MD. Schedule an appointment as soon as possible for a visit in 2 week(s).   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  Clean with soap and water only.  No ointments lotions or solutions (Betadine, hydrogen peroxide, Neosporin, etc.) VTE prophylaxis: Xarelto 15mg   daily for 30 days Pain control: Multimodal with Tylenol, Percocet, Robaxin Bone Health/Optimization: Labs show vitamin D insufficiency.  Supplement with 5000 IUs vitamin D3 daily Follow - up plan: 2 weeks Contact information: MD, Myrene Galas  PA-C  Signed:  Montez Morita, PA-C [phone] (C) 07/15/2021, 10:20 AM  [facility] [address] [place] [address] [phone] (F)

## 2021-07-15 NOTE — TOC Transition Note (Addendum)
Transition of Care [facility]) - CM/SW Discharge Note   Patient Details  Name: Christopher Pittman MRN: [id number] Date of Birth: December 25, 1995  Transition of Care [facility]) CM/SW Contact:  Epifanio Lesches, RN Phone Number: 07/15/2021, 12:40 PM   Clinical Narrative:    Patient will DC to: Home Anticipated DC date: 07/15/2021 Family notified: mom Transport by: car  12:24 pm - Called received from [employer]Adjuster), [phone], ext [phone]. Fax # [phone]. NCM to fax clinicals and  DME orders to adjuster.       - s/p IMN of L tibia  Per MD patient ready for DC today . RN, patient, and patient's parents aware of DC. DME( tub bench and 3 in1/BSC) needs noted and referral made with [facility]. Equipment will be delivered to beside prior to d/c .  Rx meds will be delivered to pt's bedside by [facility] pharmacy.  Parents to provide transportation to home.  Post hospital f/u noted on AVS.   RNCM will sign off for now as intervention is no longer needed. Please consult [place] again if new needs arise.   1415 Clinicals and DME orders faxed to [facility] ,[employer] Workman's Comp.Adjuster) @ [phone] by NCM.  Final next level of care: Home/Self Care Barriers to Discharge: No Barriers Identified   Patient Goals and CMS Choice     Choice offered to / list presented to : Patient  Discharge Placement   Discharge Plan and Services   Discharge Planning Services: CM Consult            DME Arranged: 3-N-1, Tub bench DME Agency: [facility] Date DME Agency Contacted: 07/15/21 Time DME Agency Contacted: [phone] Representative spoke with at DME Agency: [facility]            Social Determinants of Health (SDOH) Interventions     Readmission Risk Interventions No flowsheet data found.

## 2021-07-15 NOTE — TOC CAGE-AID Note (Signed)
Transition of Care [facility]) - CAGE-AID Screening   Patient Details  Name: Christopher Pittman MRN: [id number] Date of Birth: 12/28/1995  Transition [facility] Care [facility]) CM/SW Contact:    Micha Erck C Tarpley-Carter, LCSWA Phone Number: 07/15/2021, 10:23 AM   Clinical Narrative: Pt participated in Cage-Aid.  Pt stated he does not use substance or ETOH.  Pt was not offered resources, due to no usage of substance or ETOH.     Marquell Saenz Tarpley-Carter, MSW, LCSW-A Pronouns:  She/Her/Hers [facility]itions of Care Clinical Social Worker Direct Number:  [phone] [email or web address]    CAGE-AID Screening:    Have You Ever Felt You Ought to Cut Down on Your Drinking or Drug Use?: No Have People Annoyed You By [employer] Your Drinking Or Drug Use?: No Have You Felt Bad Or Guilty About Your Drinking Or Drug Use?: No Have You Ever Had a Drink or Used Drugs First Thing In The Morning to Steady Your Nerves or to Get Rid of a Hangover?: No CAGE-AID Score: 0  Substance Abuse Education Offered: No

## 2021-07-15 NOTE — Progress Notes (Signed)
Orthopaedic Trauma Service Progress Note  Patient ID: Christopher Pittman MRN: [id number] DOB/AGE: 01-Jan-1996 25 y.o.  Subjective:  Doing very well this morning.  Pain is well controlled.  Would like to go home today Awaiting therapy Very eager and motivated  Dressing changed by nursing staff this morning due to some bleeding at the knee   Review of Systems  Constitutional:  Negative for chills and fever.  HENT:  Negative for sore throat.   Respiratory:  Negative for shortness of breath and wheezing.   Cardiovascular:  Negative for chest pain and palpitations.  Gastrointestinal:  Negative for abdominal pain, nausea and vomiting.  Genitourinary:  Negative for dysuria.  Neurological:  Negative for dizziness, tingling and sensory change.   Objective:   VITALS:   Vitals:   07/14/21 1534 07/14/21 2100 07/15/21 0500 07/15/21 0822  BP: 122/74 115/79 119/63 109/64  Pulse: 100 78 61 63  Resp: 14 16 16 18   Temp: 98.5 F (36.9 C) 99.2 F (37.3 C) 98.8 F (37.1 C) 98 F (36.7 C)  TempSrc: Oral Oral Oral Oral  SpO2: 100% 100% 100% 100%  Weight:      Height:        Estimated body mass index is 23.57 kg/m as calculated from the following:   Height as of this encounter: 5\' 8"  (1.727 m).   Weight as of this encounter: 70.3 kg.   Intake/Output      10/03 0701 10/04 0700 10/04 0701 10/05 0700   P.O. 400    I.V. (mL/kg) 1000 (14.2)    IV Piggyback     Total Intake(mL/kg) 1400 (19.9)    Urine (mL/kg/hr) 600 (0.4)    Emesis/NG output 0    Stool 0    Blood 100    Total Output 700    Net +700         Urine Occurrence 1 x    Stool Occurrence 0 x    Emesis Occurrence 0 x      LABS  Results for orders placed or performed during the hospital encounter of 07/14/21 (from the past 24 hour(s))  CBC     Status: Abnormal   Collection Time: 07/14/21  4:48 PM  Result Value Ref Range   WBC 6.1 4.0 - 10.5 K/uL    RBC 4.27 4.22 - 5.81 MIL/uL   Hemoglobin 13.0 13.0 - 17.0 g/dL   HCT 09/13/21 09/13/21 - 16.6 %   MCV 91.3 80.0 - 100.0 fL   MCH 30.4 26.0 - 34.0 pg   MCHC 33.3 30.0 - 36.0 g/dL   RDW 06.3 (L) 01.6 - 01.0 %   Platelets 188 150 - 400 K/uL   nRBC 0.0 0.0 - 0.2 %  Creatinine, serum     Status: None   Collection Time: 07/14/21  4:48 PM  Result Value Ref Range   Creatinine, Ser 1.02 0.61 - 1.24 mg/dL   GFR, Estimated 35.5 09/13/21 mL/min  VITAMIN D 25 Hydroxy (Vit-D Deficiency, Fractures)     Status: Abnormal   Collection Time: 07/15/21  1:28 AM  Result Value Ref Range   Vit D, 25-Hydroxy 25.53 (L) 30 - 100 ng/mL  Basic metabolic panel     Status: Abnormal   Collection Time: 07/15/21  1:28 AM  Result Value Ref Range   Sodium 134 (L)  135 - 145 mmol/L   Potassium 4.3 3.5 - 5.1 mmol/L   Chloride 101 98 - 111 mmol/L   CO2 26 22 - 32 mmol/L   Glucose, Bld 144 (H) 70 - 99 mg/dL   BUN 13 6 - 20 mg/dL   Creatinine, Ser 8.41 0.61 - 1.24 mg/dL   Calcium 8.6 (L) 8.9 - 10.3 mg/dL   GFR, Estimated >66 >06 mL/min   Anion gap 7 5 - 15  CBC     Status: Abnormal   Collection Time: 07/15/21  1:28 AM  Result Value Ref Range   WBC 8.5 4.0 - 10.5 K/uL   RBC 4.05 (L) 4.22 - 5.81 MIL/uL   Hemoglobin 12.5 (L) 13.0 - 17.0 g/dL   HCT 30.1 (L) 60.1 - 09.3 %   MCV 91.1 80.0 - 100.0 fL   MCH 30.9 26.0 - 34.0 pg   MCHC 33.9 30.0 - 36.0 g/dL   RDW 23.5 (L) 57.3 - 22.0 %   Platelets 171 150 - 400 K/uL   nRBC 0.0 0.0 - 0.2 %     PHYSICAL EXAM:   Gen: Resting comfortably in bed, no acute distress, very pleasant and polite.  Appears well Lungs: Unlabored, clear bilaterally Cardiac: Regular rate and rhythm, S1 and S2 Abd: Soft, nontender, nondistended,  + bowel sounds Ext:       Left lower extremity  Proximal dressings removed, no active drainage.  Incision clean and intact.   Dressing reapplied to the knee and proximal lower leg  Swelling is well controlled  DPN, SPN, TN sensory functions intact  EHL, FHL,  lesser toe motor functions intact.  Ankle flexion, extension, inversion and eversion intact  No pain out of proportion with passive stretching of toes or ankle  No DCT  + DP pulse  Excellent knee and ankle range of motion    Assessment/Plan: 1 Day Post-Op     Anti-infectives (From admission, onward)    Start     Dose/Rate Route Frequency Ordered Stop   07/14/21 1830  ceFAZolin (ANCEF) IVPB 2g/100 mL premix        2 g 200 mL/hr over 30 Minutes Intravenous Every 8 hours 07/14/21 1621 07/15/21 1829   07/14/21 1015  ceFAZolin (ANCEF) IVPB 2g/100 mL premix        2 g 200 mL/hr over 30 Minutes Intravenous  Once 07/14/21 1007 07/14/21 1116   07/14/21 1009  ceFAZolin (ANCEF) 2-4 GM/100ML-% IVPB       Note to Pharmacy: Gleason, Ginger   : cabinet override      07/14/21 1009 07/14/21 1120   07/14/21 0445  ceFAZolin (ANCEF) IVPB 2g/100 mL premix        2 g 200 mL/hr over 30 Minutes Intravenous  Once 07/14/21 0436 07/14/21 0533   07/14/21 0000  cephALEXin (KEFLEX) 500 MG capsule  Status:  Discontinued        500 mg Oral 4 times daily 07/14/21 0523 07/14/21      .  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  Reviewed wound care with patient.  Reinforce dressing as needed versus removing   Ice and elevate for swelling and pain control  Encouraged to wear regular shoe when mobilizing and treat left leg like leg   PT- please teach HEP for left knee ROM- AROM, PROM. Prone exercises as well. No ROM restrictions.  Quad sets, SLR, LAQ, SAQ, heel slides, stretching, prone flexion and extension  Ankle theraband program, heel cord stretching, toe towel curls,  etc  No pillows under bend of knee when at rest, ok to place under heel to help work on extension. Can also use zero knee bone foam if available  -Complex wound left lateral leg s/p I&D  Wound care as above  Clean with soap and water only once dry.  No ointments lotions or solutions  - Pain management:  Multimodal   Tylenol   Percocet   Robaxin  - ABL anemia/Hemodynamics  Stable  - Medical issues   Vitamin D insufficiency   Supplement  - DVT/PE prophylaxis:  Will place on Xarelto 15 mg daily x 4 weeks  - ID:   Last dose of Ancef due at 1030 today.  May discharge after therapy and receives last dose of IV antibiotics  - Metabolic Bone Disease:  Vitamin D insufficiency   Supplement - Activity:  Weight-bear as tolerated with assistance---> crutches  Unrestricted range of motion left knee and ankle  - FEN/GI prophylaxis/Foley/Lines:  Regular diet  DC IV after IV antibiotics completed  - Impediments to fracture healing:  Complex wound left leg  Vitamin D insufficiency  - Dispo:  Therapy evaluations  DC home today after therapy and IV antibiotics  Will send medications to the [facility] pharmacy  Follow-up with orthopedics in 2 weeks    Mearl Latin, PA-C [phone] (C) 07/15/2021, 9:52 AM  [facility] [address] [place] [address] [phone] [phone] (F)    After 5pm and on the weekends please log on to Amion, go to orthopaedics and the look under the [facility] Call for the provider(s) on call. You can also call our office at [phone] and then follow the prompts to be connected to the call team.

## 2021-07-15 NOTE — Progress Notes (Signed)
Orthopedic Tech Progress Note Patient Details:  Christopher Pittman [birth year] [id number]  Ortho Devices Type of Ortho Device: Crutches Ortho Device/Splint Interventions: Ordered, Adjustment   Post Interventions Patient Tolerated: Well, Ambulated well Instructions Provided: Poper ambulation with device, Care of device  Donald Pore 07/15/2021, 11:59 AM

## 2021-07-15 NOTE — Discharge Instructions (Signed)
Orthopaedic Trauma Service Discharge Instructions   General Discharge Instructions  Orthopaedic Injuries:  Left tibia fracture treated with intramedullary nailing            Complex wound left lower leg treated with irrigation debridement and closure  WEIGHT BEARING STATUS: Weight-bear as tolerated left leg, use crutches to mobilize  RANGE OF MOTION/ACTIVITY: Unrestricted range of motion left knee and ankle.  Activity as tolerated.  Slowly increase activity level.  Would anticipate returning to work in 6 weeks.  Could possibly return sooner if sedentary work can be accommodated.  You would not be able to return using crutches  Bone health: Labs show vitamin D insufficiency.  Recommend supplementation with 5000 IUs of vitamin D3 daily.  Continue with your vitamin C supplementation  Wound Care: Daily wound care starting on 07/17/2021.  Please see below  Discharge Wound Care Instructions  Do NOT apply any ointments, solutions or lotions to pin sites or surgical wounds.  These prevent needed drainage and even though solutions like hydrogen peroxide kill bacteria, they also damage cells lining the pin sites that help fight infection.  Applying lotions or ointments can keep the wounds moist and can cause them to breakdown and open up as well. This can increase the risk for infection. When in doubt call the office.  Surgical incisions should be dressed daily.  If any drainage is noted, use one layer of adaptic or mepitel, then gauze, Kerlix, and an ace wrap.  [email or web address]  These dressing supplies should be available at local medical supply stores ([employer], [employer], etc). They are not usually carried at places like [facility], [facility], [employer], etc   Once the incision is completely dry and without  drainage, it may be left open to air out.  Showering may begin 36-48 hours later.  Cleaning gently with soap and water.  Traumatic wounds should be dressed daily as well.    One layer of adaptic, gauze, Kerlix, then ace wrap.  The adaptic can be discontinued once the draining has ceased    If you have a wet to dry dressing: wet the gauze with saline the squeeze as much saline out so the gauze is moist (not soaking wet), place moistened gauze over wound, then place a dry gauze over the moist one, followed by Kerlix wrap, then ace wrap.    DVT/PE prophylaxis: Xarelto 15 mg by mouth daily x 30 days  Diet: as you were eating previously.  Can use over the counter stool softeners and bowel preparations, such as Miralax, to help with bowel movements.  Narcotics can be constipating.  Be sure to drink plenty of fluids  PAIN MEDICATION USE AND EXPECTATIONS  You have likely been given narcotic medications to help control your pain.  After a traumatic event that results in an fracture (broken bone) with or without surgery, it is ok to use narcotic pain medications to help control one's pain.  We understand that everyone responds to pain differently and each individual patient will be evaluated on a regular basis for the continued need for narcotic medications. Ideally, narcotic medication use should last no more than 6-8 weeks (coinciding with fracture healing).   As a patient it is your responsibility as well to monitor narcotic medication use and report the amount and frequency you use these medications when you come to your office visit.   We would also advise that if you are using narcotic medications, you should take a dose prior to therapy to  maximize you participation.  IF YOU ARE ON NARCOTIC MEDICATIONS IT IS NOT PERMISSIBLE TO OPERATE A MOTOR VEHICLE (MOTORCYCLE/CAR/TRUCK/MOPED) OR HEAVY MACHINERY DO NOT MIX NARCOTICS WITH OTHER CNS (CENTRAL NERVOUS SYSTEM) DEPRESSANTS SUCH AS  ALCOHOL   POST-OPERATIVE OPIOID TAPER INSTRUCTIONS: It is important to wean off of your opioid medication as soon as possible. If you do not need pain medication after your surgery it is ok to stop day one. Opioids include: Codeine, Hydrocodone(Norco, Vicodin), Oxycodone(Percocet, oxycontin) and hydromorphone amongst others.  Long term and even short term use of opiods can cause: Increased pain response Dependence Constipation Depression Respiratory depression And more.  Withdrawal symptoms can include Flu like symptoms Nausea, vomiting And more Techniques to manage these symptoms Hydrate well Eat regular healthy meals Stay active Use relaxation techniques(deep breathing, meditating, yoga) Do Not substitute Alcohol to help with tapering If you have been on opioids for less than two weeks and do not have pain than it is ok to stop all together.  Plan to wean off of opioids This plan should start within one week post op of your fracture surgery  Maintain the same interval or time between taking each dose and first decrease the dose.  Cut the total daily intake of opioids by one tablet each day Next start to increase the time between doses. The last dose that should be eliminated is the evening dose.    STOP SMOKING OR USING NICOTINE PRODUCTS!!!!  As discussed nicotine severely impairs your body's ability to heal surgical and traumatic wounds but also impairs bone healing.  Wounds and bone heal by forming microscopic blood vessels (angiogenesis) and nicotine is a vasoconstrictor (essentially, shrinks blood vessels).  Therefore, if vasoconstriction occurs to these microscopic blood vessels they essentially disappear and are unable to deliver necessary nutrients to the healing tissue.  This is one modifiable factor that you can do to dramatically increase your chances of healing your injury.    (This means no smoking, no nicotine gum, patches, etc)  DO NOT USE NONSTEROIDAL  ANTI-INFLAMMATORY DRUGS (NSAID'S)  Using products such as Advil (ibuprofen), Aleve (naproxen), Motrin (ibuprofen) for additional pain control during fracture healing can delay and/or prevent the healing response.  If you would like to take over the counter (OTC) medication, Tylenol (acetaminophen) is ok.  However, some narcotic medications that are given for pain control contain acetaminophen as well. Therefore, you should not exceed more than 4000 mg of tylenol in a day if you do not have liver disease.  Also note that there are may OTC medicines, such as cold medicines and allergy medicines that my contain tylenol as well.  If you have any questions about medications and/or interactions please ask your doctor/PA or your pharmacist.      ICE AND ELEVATE INJURED/OPERATIVE EXTREMITY  Using ice and elevating the injured extremity above your heart can help with swelling and pain control.  Icing in a pulsatile fashion, such as 20 minutes on and 20 minutes off, can be followed.    Do not place ice directly on skin. Make sure there is a barrier between to skin and the ice pack.    Using frozen items such as frozen peas works well as the conform nicely to the are that needs to be iced.  USE AN ACE WRAP OR TED HOSE FOR SWELLING CONTROL  In addition to icing and elevation, Ace wraps or TED hose are used to help limit and resolve swelling.  It is recommended to use Ace wraps  or TED hose until you are informed to stop.    When using Ace Wraps start the wrapping distally (farthest away from the body) and wrap proximally (closer to the body)   Example: If you had surgery on your leg or thing and you do not have a splint on, start the ace wrap at the toes and work your way up to the thigh        If you had surgery on your upper extremity and do not have a splint on, start the ace wrap at your fingers and work your way up to the upper arm  IF YOU ARE IN A SPLINT OR CAST DO NOT REMOVE IT FOR ANY REASON   If your  splint gets wet for any reason please contact the office immediately. You may shower in your splint or cast as long as you keep it dry.  This can be done by wrapping in a cast cover or garbage back (or similar)  Do Not stick any thing down your splint or cast such as pencils, money, or hangers to try and scratch yourself with.  If you feel itchy take benadryl as prescribed on the bottle for itching  IF YOU ARE IN A CAM BOOT (BLACK BOOT)  You may remove boot periodically. Perform daily dressing changes as noted below.  Wash the liner of the boot regularly and wear a sock when wearing the boot. It is recommended that you sleep in the boot until told otherwise    Call office for the following: Temperature greater than 101F Persistent nausea and vomiting Severe uncontrolled pain Redness, tenderness, or signs of infection (pain, swelling, redness, odor or green/yellow discharge around the site) Difficulty breathing, headache or visual disturbances Hives Persistent dizziness or light-headedness Extreme fatigue Any other questions or concerns you may have after discharge  In an emergency, call 911 or go to an Emergency Department at a nearby hospital  HELPFUL INFORMATION  If you had a block, it will wear off between 8-24 hrs postop typically.  This is period when your pain may go from nearly zero to the pain you would have had postop without the block.  This is an abrupt transition but nothing dangerous is happening.  You may take an extra dose of narcotic when this happens.  You should wean off your narcotic medicines as soon as you are able.  Most patients will be off or using minimal narcotics before their first postop appointment.   We suggest you use the pain medication the first night prior to going to bed, in order to ease any pain when the anesthesia wears off. You should avoid taking pain medications on an empty stomach as it will make you nauseous.  Do not drink alcoholic beverages or  take illicit drugs when taking pain medications.  In most states it is against the law to drive while you are in a splint or sling.  And certainly against the law to drive while taking narcotics.  You may return to work/school in the next couple of days when you feel up to it.   Pain medication may make you constipated.  Below are a few solutions to try in this order: Decrease the amount of pain medication if you aren't having pain. Drink lots of decaffeinated fluids. Drink prune juice and/or each dried prunes  If the first 3 don't work start with additional solutions Take Colace - an over-the-counter stool softener Take Senokot - an over-the-counter laxative Take Miralax - a  stronger over-the-counter laxative     CALL THE OFFICE WITH ANY QUESTIONS OR CONCERNS: [phone]   VISIT OUR WEBSITE FOR ADDITIONAL INFORMATION: [email or web address]

## 2021-07-15 NOTE — Evaluation (Signed)
Occupational Therapy Evaluation Patient Details Name: Christopher Pittman MRN: [id number] DOB: [birth year] Today's Date: 07/15/2021   History of Present Illness Pt is pleasant 25 y/o M admitted 07/14/21 with LLE pain and found to have L tibial fracture. Pt is s/p intramedullary nailing left tibia 07/14/21.   Clinical Impression   Pt presents with decreased balance and mobility and LLE pain. Pt requiring supervision - setup Pittman with some ADLs and Min guard for functional mobility using RW. Pt with support from family and significant other available 24 hours as needed upon d/Pittman. Pt educated on compensatory strategies for ADLs and AE use, verbalizing/demonstrating understanding. No further skilled OT needs at this time. Will sign off.      Recommendations for follow up therapy are one component of Pittman multi-disciplinary discharge planning process, led by the attending physician.  Recommendations may be updated based on patient status, additional functional criteria and insurance authorization.   Follow Up Recommendations  No OT follow up    Equipment Recommendations  3 in 1 bedside commode;Tub/shower bench    Recommendations for Other Services       Precautions / Restrictions Precautions Precautions: None Restrictions Weight Bearing Restrictions: Yes LLE Weight Bearing: Weight bearing as tolerated      Mobility Bed Mobility Overal bed mobility: Independent                  Transfers                      Balance Overall balance assessment: Needs assistance Sitting-balance support: No upper extremity supported;Feet supported Sitting balance-Leahy Scale: Good     Standing balance support: Bilateral upper extremity supported Standing balance-Leahy Scale: Poor                             ADL either performed or assessed with clinical judgement   ADL Overall ADL's : Needs assistance/impaired Eating/Feeding: Independent   Grooming: Independent   Upper Body  Bathing: Modified independent   Lower Body Bathing: Supervison/ safety   Upper Body Dressing : Independent   Lower Body Dressing: Set up   Toilet Transfer: Supervision/safety;BSC   Toileting- Clothing Manipulation and Hygiene: Independent   Tub/ Shower Transfer: Supervision/safety;Tub bench   Functional mobility during ADLs: Min guard       Vision   Vision Assessment?: No apparent visual deficits     Perception     Praxis      Pertinent Vitals/Pain Pain Assessment: 0-10 Pain Score: 8  Pain Location: LLE Pain Descriptors / Indicators: Aching;Sore Pain Intervention(s): Monitored during session;Repositioned     Hand Dominance     Extremity/Trunk Assessment Upper Extremity Assessment Upper Extremity Assessment: Overall WFL for tasks assessed   Lower Extremity Assessment Lower Extremity Assessment: Defer to PT evaluation   Cervical / Trunk Assessment Cervical / Trunk Assessment: Normal   Communication     Cognition Arousal/Alertness: Awake/alert Behavior During Therapy: WFL for tasks assessed/performed Overall Cognitive Status: Within Functional Limits for tasks assessed                                     General Comments       Exercises     Shoulder Instructions      Home Living Family/patient expects to be discharged to:: Private residence Living Arrangements: Parent Available Help at Discharge: Family;Friend(s);Available 24 hours/day  Type of Home: House Home Access: Stairs to enter [employer] of Steps: 1   Home Layout: Two level;Bed/bath upstairs Alternate Level Stairs-Number of Steps: 12 Alternate Level Stairs-Rails: Left Bathroom Shower/Tub: Chief Strategy Officer: Standard     Home Equipment: Grab bars - tub/shower          Prior Functioning/Environment Level of Independence: Independent                 OT Problem List: Impaired balance (sitting and/or standing)      OT  Treatment/Interventions:      OT Goals(Current goals can be found in the care plan section) Acute Rehab OT Goals Patient Stated Goal: return home OT Goal Formulation: With patient  OT Frequency:     Barriers to D/Pittman:            Co-evaluation              AM-PAC OT "6 Clicks" Daily Activity     Outcome Measure Help from another person eating meals?: None Help from another person taking care of personal grooming?: None Help from another person toileting, which includes using toliet, bedpan, or urinal?: Pittman Little Help from another person bathing (including washing, rinsing, drying)?: Pittman Little Help from another person to put on and taking off regular upper body clothing?: None Help from another person to put on and taking off regular lower body clothing?: Pittman Little 6 Click Score: 21   End of Session Equipment Utilized During Treatment: Rolling walker Nurse Communication: Mobility status  Activity Tolerance: Patient limited by pain Patient left: in chair;with call bell/phone within reach;with family/visitor present  OT Visit Diagnosis: Unsteadiness on feet (R26.81)                Time: 1610-9604 OT Time Calculation (min): 29 min Charges:  OT General Charges $OT Visit: 1 Visit OT Evaluation $OT Eval Low Complexity: 1 Low  Christopher Pittman, OT/L  Acute Rehab [phone]   Christopher Pittman Christopher Pittman 07/15/2021, 11:18 AM

## 2021-07-16 ENCOUNTER — Other Ambulatory Visit (HOSPITAL_COMMUNITY): Payer: Self-pay

## 2021-07-18 ENCOUNTER — Telehealth: Payer: Self-pay | Admitting: Pharmacist

## 2021-07-18 NOTE — Telephone Encounter (Signed)
Pharmacy Transitions of Care Follow-up Telephone Call  Date of discharge: 07/15/2021  Discharge Diagnosis: DVT Prophylaxis  How have you been since you were released from the hospital? [name] is still in pain from his surgery but other wise doing well.   Medication changes made at discharge:  - START: Xarelto 15mg   - STOPPED: n/a  - CHANGED: n/a  Medication changes verified by the patient? yes    Medication Accessibility:  Home Pharmacy: not discussed   Was the patient provided with refills on discharged medications? No, currently medication is for 1 month only.   Have all prescriptions been transferred from [facility] to home pharmacy? N/a   Is the patient able to afford medications? Not discussed, therapy currently limited to 1 month. Notable copays: n/a Eligible patient assistance: n/a    Medication Review: RIVAROXABAN (XARELTO)  Rivaroxaban 15 mg BID initiated on 07/15/21.  - Discussed importance of taking medication with food and around the same time everyday  - Reviewed potential DDIs with patient  - Advised patient of medications to avoid (NSAIDs, ASA)  - Educated that Tylenol (acetaminophen) will be the preferred analgesic to prevent risk of bleeding  - Emphasized importance of monitoring for signs and symptoms of bleeding (abnormal bruising, prolonged bleeding, nose bleeds, bleeding from gums, discolored urine, black tarry stools)  - Advised patient to alert all providers of anticoagulation therapy prior to starting a new medication or having a procedure  Follow-up Appointments:  [facility] f/u appt confirmed? yes Scheduled to see orthopedics on 07/30/21.   If their condition worsens, is the pt aware to call PCP or go to the Emergency Dept.? yes  Final Patient Assessment: Christopher Pittman is in good spirits and understands his medication.

## 2021-07-23 ENCOUNTER — Other Ambulatory Visit (HOSPITAL_COMMUNITY): Payer: Self-pay | Admitting: Orthopedic Surgery

## 2021-07-23 ENCOUNTER — Emergency Department (HOSPITAL_COMMUNITY)
Admission: EM | Admit: 2021-07-23 | Discharge: 2021-07-23 | Disposition: A | Discharge status: Home / Self Care | Payer: Worker's Compensation | Attending: Orthopedic Surgery | Admitting: Orthopedic Surgery

## 2021-07-23 ENCOUNTER — Emergency Department (HOSPITAL_COMMUNITY): Payer: Worker's Compensation

## 2021-07-23 ENCOUNTER — Emergency Department (HOSPITAL_BASED_OUTPATIENT_CLINIC_OR_DEPARTMENT_OTHER)
Admit: 2021-07-23 | Discharge: 2021-07-23 | Disposition: A | Discharge status: Home / Self Care | Payer: Worker's Compensation | Attending: Orthopedic Surgery | Admitting: Orthopedic Surgery

## 2021-07-23 ENCOUNTER — Emergency Department (HOSPITAL_COMMUNITY): Admit: 2021-07-23 | Payer: Worker's Compensation

## 2021-07-23 ENCOUNTER — Emergency Department (HOSPITAL_COMMUNITY): Admission: RE | Admit: 2021-07-23 | Payer: Worker's Compensation | Source: Ambulatory Visit

## 2021-07-23 ENCOUNTER — Other Ambulatory Visit: Payer: Self-pay

## 2021-07-23 ENCOUNTER — Encounter (HOSPITAL_COMMUNITY): Payer: Self-pay

## 2021-07-23 ENCOUNTER — Other Ambulatory Visit: Payer: Self-pay | Admitting: Orthopedic Surgery

## 2021-07-23 ENCOUNTER — Emergency Department (HOSPITAL_COMMUNITY)
Admit: 2021-07-23 | Discharge: 2021-07-23 | Disposition: A | Discharge status: Home / Self Care | Payer: Worker's Compensation | Attending: Orthopedic Surgery | Admitting: Orthopedic Surgery

## 2021-07-23 ENCOUNTER — Encounter (HOSPITAL_COMMUNITY): Payer: Self-pay | Admitting: Emergency Medicine

## 2021-07-23 DIAGNOSIS — Z5321 Procedure and treatment not carried out due to patient leaving prior to being seen by health care provider: Secondary | ICD-10-CM | POA: Insufficient documentation

## 2021-07-23 DIAGNOSIS — R0602 Shortness of breath: Secondary | ICD-10-CM

## 2021-07-23 DIAGNOSIS — R0789 Other chest pain: Secondary | ICD-10-CM | POA: Insufficient documentation

## 2021-07-23 DIAGNOSIS — R52 Pain, unspecified: Secondary | ICD-10-CM

## 2021-07-23 DIAGNOSIS — M79662 Pain in left lower leg: Secondary | ICD-10-CM

## 2021-07-23 LAB — CBC WITH DIFFERENTIAL/PLATELET
Abs Immature Granulocytes: 0.04 10*3/uL (ref 0.00–0.07)
Basophils Absolute: 0 10*3/uL (ref 0.0–0.1)
Basophils Relative: 0 %
Eosinophils Absolute: 0 10*3/uL (ref 0.0–0.5)
Eosinophils Relative: 0 %
HCT: 36.3 % — ABNORMAL LOW (ref 39.0–52.0)
Hemoglobin: 11.9 g/dL — ABNORMAL LOW (ref 13.0–17.0)
Immature Granulocytes: 0 %
Lymphocytes Relative: 7 %
Lymphs Abs: 0.7 10*3/uL (ref 0.7–4.0)
MCH: 30.4 pg (ref 26.0–34.0)
MCHC: 32.8 g/dL (ref 30.0–36.0)
MCV: 92.6 fL (ref 80.0–100.0)
Monocytes Absolute: 0.5 10*3/uL (ref 0.1–1.0)
Monocytes Relative: 5 %
Neutro Abs: 8.1 10*3/uL — ABNORMAL HIGH (ref 1.7–7.7)
Neutrophils Relative %: 88 %
Platelets: 265 10*3/uL (ref 150–400)
RBC: 3.92 MIL/uL — ABNORMAL LOW (ref 4.22–5.81)
RDW: 11.1 % — ABNORMAL LOW (ref 11.5–15.5)
WBC: 9.2 10*3/uL (ref 4.0–10.5)
nRBC: 0 % (ref 0.0–0.2)

## 2021-07-23 LAB — COMPREHENSIVE METABOLIC PANEL
ALT: 21 U/L (ref 0–44)
AST: 22 U/L (ref 15–41)
Albumin: 3.8 g/dL (ref 3.5–5.0)
Alkaline Phosphatase: 65 U/L (ref 38–126)
Anion gap: 10 (ref 5–15)
BUN: 17 mg/dL (ref 6–20)
CO2: 26 mmol/L (ref 22–32)
Calcium: 9.4 mg/dL (ref 8.9–10.3)
Chloride: 100 mmol/L (ref 98–111)
Creatinine, Ser: 1.03 mg/dL (ref 0.61–1.24)
GFR, Estimated: >60 mL/min (ref >60)
Glucose, Bld: 116 mg/dL — ABNORMAL HIGH (ref 70–99)
Potassium: 4.4 mmol/L (ref 3.5–5.1)
Sodium: 136 mmol/L (ref 135–145)
Total Bilirubin: 1.7 mg/dL — ABNORMAL HIGH (ref 0.3–1.2)
Total Protein: 7.2 g/dL (ref 6.5–8.1)

## 2021-07-23 LAB — TROPONIN I (HIGH SENSITIVITY): Troponin I (High Sensitivity): 5 ng/L (ref <18)

## 2021-07-23 MED ORDER — IOHEXOL 350 MG/ML SOLN
75.0000 mL | Freq: Once | INTRAVENOUS | Status: AC | PRN
  Administered 2021-07-23: 75 mL via INTRAVENOUS

## 2021-07-23 NOTE — Progress Notes (Signed)
Left lower extremity venous duplex has been completed. Preliminary results can be found in CV Proc through chart review.  Results were given to [facility] at Dr. Magdalene Patricia office.  07/23/21 2:26 PM Olen Cordial RVT

## 2021-07-23 NOTE — Progress Notes (Signed)
Attempted lower extremity venous duplex, however when patient's name was called in the ED waiting room multiple times, there was no response. Will attempt again later as schedule permits.  07/23/2021 1:36 PM Eula Fried., MHA, RVT, RDCS, RDMS

## 2021-07-23 NOTE — ED Notes (Signed)
Pt called 2x for room w/o response.  

## 2021-07-23 NOTE — ED Provider Notes (Signed)
Emergency Medicine Provider Triage Evaluation Note  Christopher Pittman , a 25 y.o. male  was evaluated in triage.  Pt complains of Chest pain and shortness of breath.  Had surgery on his left leg 1 week ago, was suppose to follow-up in the office today but the chest pain and shortness of breath was too intense.  He is having swelling and increased pain to the left calf, full by orthopedic doctor to come to ED for evaluation for DVT.Marland Kitchen  Review of Systems  Positive: Chest pain, shortness of breath, left leg swelling. Negative: Nausea, vomiting  Physical Exam  BP 131/81 (BP Location: Right Arm)   Pulse (!) 115   Temp 99.1 F (37.3 C)   Resp 18   Ht 5\' 8"  (1.727 m)   Wt 70.3 kg   SpO2 100%   BMI 23.57 kg/m  Gen:   Awake, no distress   Resp:  Normal effort  MSK:   Left leg in brace.  Swelling, DP and PT 2+. Other:  Tachycardic  Medical Decision Making  Medically screening exam initiated at 12:02 PM.  Appropriate orders placed.  Christopher Pittman was informed that the remainder of the evaluation will be completed by another provider, this initial triage assessment does not replace that evaluation, and the importance of remaining in the ED until their evaluation is complete.  Will start with labs and DVT study.   Darryll Capers, PA-C 07/23/21 1203    09/22/21, MD 07/23/21 1334

## 2021-07-23 NOTE — ED Notes (Signed)
PT called for room 3rd time w/o response. Pt appears not to be here at this time.

## 2021-07-23 NOTE — ED Triage Notes (Signed)
Pt endorses SOB and chest tightness since last night. Central CP with no radiation. States he is to have DVT ruled out also due to recent broken left leg.

## 2021-07-28 ENCOUNTER — Telehealth: Payer: Self-pay | Admitting: *Deleted

## 2021-07-28 DIAGNOSIS — J189 Pneumonia, unspecified organism: Secondary | ICD-10-CM | POA: Diagnosis not present

## 2021-07-28 NOTE — Telephone Encounter (Signed)
CXR at ER visit showed RLL opacity - early PNA vs atx however CTA was clear.  Recommend eval esp if ongoing symptomatic. He could schedule sooner appt at our office than next week, or go to [facility].  Would want negative COVID test if seen in office.

## 2021-07-28 NOTE — Telephone Encounter (Signed)
Pt stated that he is going to go to an UC and call with an update

## 2021-07-28 NOTE — Telephone Encounter (Signed)
PLEASE NOTE: All timestamps contained within this report are represented as [place] Standard Time. CONFIDENTIALTY NOTICE: This fax transmission is intended only for the addressee. It contains information that is legally privileged, confidential or otherwise protected from use or disclosure. If you are not the intended recipient, you are strictly prohibited from reviewing, disclosing, copying using or disseminating any of this information or taking any action in reliance on or regarding this information. If you have received this fax in error, please notify us immediately by telephone so that we can arrange for its return to [place]. Phone: [phone], Toll-Free: [phone], Fax: [phone] Page: 1 of 2 Call Id: [id number] [place] [facility] Night - Client TELEPHONE ADVICE RECORD AccessNurse Patient Name: Christopher Pittman Gender: Male DOB: 01-25-1996 Age: 25 Y 11 M 3 D Return Phone Number: [phone] (Primary), [phone] (Secondary) Address: [place] [address] [place] [facility] Night - Client Client Site  [facility] [place] - Night Physician Eustaquio Boyden - MD Contact Type Call Who Is Calling Patient / Member / Family / Caregiver Call Type Triage / Clinical Caller Name Tayshawn Purnell Relationship To Patient Mother Return Phone Number [phone] (Primary) Chief Complaint BREATHING - shortness of breath or sounds breathless Reason for Call Symptomatic / Request for Health Information Initial Comment Caller states her son has an appointment on 08/05/2021. The pt had a workplace injury and now the pt had a scan that indicated the pt has early on set pneumonia. The pt is short of breath. Translation No Nurse Assessment Nurse: Valentina Lucks, RN, Tempie Hoist Date/Time Lamount Cohen Time): 07/26/2021 1:04:47 PM Confirm and document reason for call. If symptomatic, describe symptoms. ---Caller states that  patient had a work place injury and they done some scans. Scans revealed that patient has early onset pneumonia. Caller states patient has been having difficulty breathing. Patient is seeking medications for pneumonia. Caller states no other sx. Does the patient have any new or worsening symptoms? ---Yes Will a triage be completed? ---Yes Related visit to physician within the last 2 weeks? ---Yes Does the PT have any chronic conditions? (i.e. diabetes, asthma, this includes High risk factors for pregnancy, etc.) ---No Is this a behavioral health or substance abuse call? ---No Guidelines Guideline Title Affirmed Question Affirmed Notes Nurse Date/Time (Eastern Time) Pneumonia Followup Call MODERATE difficulty breathing (e.g., speaks in phrases, SOB even at rest, pulse 100-120) Valentina Lucks, RN, Tempie Hoist 07/26/2021 1:06:02 PM PLEASE NOTE: All timestamps contained within this report are represented as [place] Standard Time. CONFIDENTIALTY NOTICE: This fax transmission is intended only for the addressee. It contains information that is legally privileged, confidential or otherwise protected from use or disclosure. If you are not the intended recipient, you are strictly prohibited from reviewing, disclosing, copying using or disseminating any of this information or taking any action in reliance on or regarding this information. If you have received this fax in error, please notify us immediately by telephone so that we can arrange for its return to [place]. Phone: [phone], Toll-Free: [phone], Fax: [phone] Page: 2 of 2 Call Id: [id number] Disp. Time Lamount Cohen Time) Disposition Final User 07/26/2021 1:01:41 PM Send to Urgent Matilde Sprang 07/26/2021 1:11:59 PM Go to ED Now (or PCP triage) Jarome Lamas, RN, Tempie Hoist Caller Disagree/Comply Comply Caller Understands Yes PreDisposition Did not know what to do Care Advice Given Per Guideline GO TO ED NOW (OR PCP  TRIAGE): * IF NO PCP (PRIMARY CARE PROVIDER) SECOND-LEVEL TRIAGE: You need to  be seen within the next hour. Go to the ED/UCC at _____________ Hospital. Leave as soon as you can. ANOTHER ADULT SHOULD DRIVE: * It is better and safer if another adult drives instead of you. CARE ADVICE given per Pneumonia on Antibiotic Follow-Up Call (Adult) guideline. Referrals GO TO FACILITY UNDECIDED

## 2021-07-28 NOTE — Telephone Encounter (Addendum)
Left a message on patient's voicemail to call the office back. Called the other number listed and got patient's mom on the phone. Was advised that she is at work. Advised mom that I have left a message on patient's voicemail for him to call the office back.  Patient's mom stated that he is probably still asleep. No DPR seen on record.  Patient has not established care at our office yet.

## 2021-08-04 ENCOUNTER — Emergency Department: Payer: Worker's Compensation

## 2021-08-04 ENCOUNTER — Other Ambulatory Visit: Payer: Self-pay

## 2021-08-04 DIAGNOSIS — J40 Bronchitis, not specified as acute or chronic: Secondary | ICD-10-CM | POA: Insufficient documentation

## 2021-08-04 DIAGNOSIS — R0789 Other chest pain: Secondary | ICD-10-CM | POA: Diagnosis present

## 2021-08-04 DIAGNOSIS — Z20822 Contact with and (suspected) exposure to covid-19: Secondary | ICD-10-CM | POA: Insufficient documentation

## 2021-08-04 DIAGNOSIS — Z7901 Long term (current) use of anticoagulants: Secondary | ICD-10-CM | POA: Insufficient documentation

## 2021-08-04 DIAGNOSIS — R109 Unspecified abdominal pain: Secondary | ICD-10-CM | POA: Insufficient documentation

## 2021-08-04 LAB — BASIC METABOLIC PANEL
Anion gap: 9 (ref 5–15)
BUN: 15 mg/dL (ref 6–20)
CO2: 27 mmol/L (ref 22–32)
Calcium: 9.6 mg/dL (ref 8.9–10.3)
Chloride: 99 mmol/L (ref 98–111)
Creatinine, Ser: 1.04 mg/dL (ref 0.61–1.24)
GFR, Estimated: >60 mL/min (ref >60)
Glucose, Bld: 115 mg/dL — ABNORMAL HIGH (ref 70–99)
Potassium: 4.1 mmol/L (ref 3.5–5.1)
Sodium: 135 mmol/L (ref 135–145)

## 2021-08-04 LAB — CBC
HCT: 41 % (ref 39.0–52.0)
Hemoglobin: 14.4 g/dL (ref 13.0–17.0)
MCH: 31.8 pg (ref 26.0–34.0)
MCHC: 35.1 g/dL (ref 30.0–36.0)
MCV: 90.5 fL (ref 80.0–100.0)
Platelets: 337 10*3/uL (ref 150–400)
RBC: 4.53 MIL/uL (ref 4.22–5.81)
RDW: 11.3 % — ABNORMAL LOW (ref 11.5–15.5)
WBC: 9 10*3/uL (ref 4.0–10.5)
nRBC: 0 % (ref 0.0–0.2)

## 2021-08-04 NOTE — ED Triage Notes (Signed)
Pt comes with c/o some belly pain and recently dx with pneumonia. Pt had CT performed on 10-12 to rule out PE.

## 2021-08-05 ENCOUNTER — Telehealth: Payer: Self-pay | Admitting: Family Medicine

## 2021-08-05 ENCOUNTER — Emergency Department
Admission: EM | Admit: 2021-08-05 | Discharge: 2021-08-05 | Disposition: A | Discharge status: Home / Self Care | Payer: Worker's Compensation | Attending: Emergency Medicine | Admitting: Emergency Medicine

## 2021-08-05 ENCOUNTER — Emergency Department: Payer: Worker's Compensation

## 2021-08-05 DIAGNOSIS — J40 Bronchitis, not specified as acute or chronic: Secondary | ICD-10-CM

## 2021-08-05 LAB — RESP PANEL BY RT-PCR (FLU A&B, COVID) ARPGX2
Influenza A by PCR: NEGATIVE
Influenza B by PCR: NEGATIVE
SARS Coronavirus 2 by RT PCR: NEGATIVE

## 2021-08-05 LAB — D-DIMER, QUANTITATIVE: D-Dimer, Quant: 0.97 ug/mL-FEU — ABNORMAL HIGH (ref 0.00–0.50)

## 2021-08-05 LAB — TROPONIN I (HIGH SENSITIVITY): Troponin I (High Sensitivity): 2 ng/L (ref <18)

## 2021-08-05 MED ORDER — IOHEXOL 350 MG/ML SOLN
75.0000 mL | Freq: Once | INTRAVENOUS | Status: AC | PRN
  Administered 2021-08-05: 75 mL via INTRAVENOUS
  Filled 2021-08-05: qty 75

## 2021-08-05 NOTE — Discharge Instructions (Addendum)
Continue the antibiotics and steroids prescribed to you.  Continue to use albuterol as needed for chest tightness.  Follow-up with your primary care doctor in 2 days.  Return to the emergency room for new or worsening chest pain, shortness of breath, or fever.

## 2021-08-05 NOTE — ED Notes (Signed)
Patient discharged to home per MD order. Patient in stable condition, and deemed medically cleared by ED provider for discharge. Discharge instructions reviewed with patient/family using "Teach Back"; verbalized understanding of medication education and administration, and information about follow-up care. Denies further concerns. ° °

## 2021-08-05 NOTE — ED Provider Notes (Signed)
[facility] Emergency Department Provider Note  ____________________________________________  Time seen: Approximately 2:32 AM  I have reviewed the triage vital signs and the nursing notes.   HISTORY  Chief Complaint Abdominal Pain   HPI Christopher Pittman is a 25 y.o. male with a history of asthma, currently POD 22 for open tibia/fibula fracture repair who presents for evaluation of chest pain.  Patient has been having chest pain for 13 days.  Was seen initially on day 1 of these episodes and had a chest x-ray that was concerning for possible atelectasis versus pneumonia.  He had a CT follow-up which was negative for PE or pneumonia but he was placed on amoxicillin anyways.  He took 4 days of amoxicillin and then was transitioned to doxycycline.  He has taken another 4 days of doxycycline.  He was also started on prednisone and albuterol inhaler.  He describes his pain as a tightness in his chest that resolves with an albuterol inhaler.  He has had a dry cough associated with it.  He denies pleuritic or constant sharp chest pain, shortness of breath, fever, chills, nausea, vomiting.  He is on Xarelto and has not missed any doses.  He denies any personal or family history of PE or DVT.  He denies any hemoptysis.  He reports that his symptoms have not resolved but are not worse.  Initially in triage was documented the patient was complaining of abdominal pain.  Patient denies any abdominal pain   Past Medical History:  Diagnosis Date   Open fracture of left tibia and fibula, type I or II, initial encounter 07/14/2021   Seasonal allergies    Vitamin D insufficiency 07/15/2021    Patient Active Problem List   Diagnosis Date Noted   Vitamin D insufficiency 07/15/2021   Open left tibial fracture 07/14/2021   Seasonal allergic rhinitis 03/09/2018    Past Surgical History:  Procedure Laterality Date   ADENOIDECTOMY     I & D EXTREMITY Left 07/14/2021   Procedure:  IRRIGATION AND DEBRIDEMENT EXTREMITY;  Surgeon: Myrene Galas, MD;  Location: [facility] OR;  Service: Orthopedics;  Laterality: Left;   MYRINGOTOMY     TIBIA IM NAIL INSERTION Left 07/14/2021   Procedure: INTRAMEDULLARY (IM) NAIL TIBIAL;  Surgeon: Myrene Galas, MD;  Location: [facility] OR;  Service: Orthopedics;  Laterality: Left;   TYMPANOPLASTY Left 04/08/2020   Procedure: LEFT TYMPANOPLASTY;  Surgeon: [name], MD;  Location: [facility];  Service: ENT;  Laterality: Left;    Prior to Admission medications   Medication Sig Start Date End Date Taking? Authorizing Provider  acetaminophen (TYLENOL) 500 MG tablet Take 1 tablet (500 mg total) by mouth every 12 (twelve) hours. 07/15/21   Montez Morita, PA-C  cetirizine (ZYRTEC) 10 MG tablet Take 10 mg by mouth daily.    [provider]  Cholecalciferol 125 MCG (5000 UT) TABS Take 1 tablet (5,000 Units total) by mouth daily. 07/15/21   Montez Morita, PA-C  docusate sodium (COLACE) 100 MG capsule Take 1 capsule (100 mg total) by mouth daily. 07/15/21   Montez Morita, PA-C  ketorolac (TORADOL) 10 MG tablet Take 1 tablet (10 mg total) by mouth every 6 (six) hours as needed for moderate pain. 07/15/21   Montez Morita, PA-C  methocarbamol (ROBAXIN) 500 MG tablet Take 1-2 tablets (500-1,000 mg total) by mouth every 8 (eight) hours as needed for muscle spasms. 07/15/21   Montez Morita, PA-C  montelukast (SINGULAIR) 10 MG tablet Take 10 mg  by mouth at bedtime.    [provider]  multivitamin (ONE-A-DAY MEN'S) TABS tablet Take 1 tablet by mouth daily.    [provider]  oxyCODONE-acetaminophen (PERCOCET) 5-325 MG tablet Take 1-2 tablets by mouth every 6 (six) hours as needed for severe pain. 07/15/21   Montez Morita, PA-C  Rivaroxaban (XARELTO) 15 MG TABS tablet Take 1 tablet (15 mg total) by mouth daily with supper. 07/15/21 08/14/21  Montez Morita, PA-C  vitamin C (ASCORBIC ACID) 500 MG tablet Take 500 mg by mouth daily.    [provider]    Allergies Patient has no known allergies.  No family history on file.  Social History Social History   Tobacco Use   Smoking status: Never   Smokeless tobacco: Never  Vaping Use   Vaping Use: Never used  Substance Use Topics   Alcohol use: Never   Drug use: Never    Review of Systems  Constitutional: Negative for fever. Eyes: Negative for visual changes. ENT: Negative for sore throat. Neck: No neck pain  Cardiovascular: Negative for chest pain. + Chest tightness Respiratory: Negative for shortness of breath. + Cough Gastrointestinal: Negative for abdominal pain, vomiting or diarrhea. Genitourinary: Negative for dysuria. Musculoskeletal: Negative for back pain. Skin: Negative for rash. Neurological: Negative for headaches, weakness or numbness. Psych: No SI or HI  ____________________________________________   PHYSICAL EXAM:  VITAL SIGNS: ED Triage Vitals  Enc Vitals Group     BP 08/04/21 1749 (!) 141/77     Pulse Rate 08/04/21 1749 (!) 109     Resp 08/04/21 1749 17     Temp 08/04/21 1749 98.5 F (36.9 C)     Temp Source 08/04/21 1749 Oral     SpO2 08/04/21 1749 100 %     Weight --      Height --      Head Circumference --      Peak Flow --      Pain Score 08/04/21 1749 6     Pain Loc --      Pain Edu? --      Excl. in GC? --     Constitutional: Alert and oriented. Well appearing and in no apparent distress. HEENT:      Head: Normocephalic and atraumatic.         Eyes: Conjunctivae are normal. Sclera is non-icteric.       Mouth/Throat: Mucous membranes are moist.       Neck: Supple with no signs of meningismus. Cardiovascular: Regular rate and rhythm. No murmurs, gallops, or rubs. 2+ symmetrical distal pulses are present in all extremities. No JVD. Respiratory: Normal respiratory effort. Lungs are clear to auscultation bilaterally.  Gastrointestinal: Soft, non tender, and non distended with positive bowel sounds. No rebound or  guarding. Genitourinary: No CVA tenderness. Musculoskeletal:  No edema, cyanosis, or erythema of extremities. Neurologic: Normal speech and language. Face is symmetric. Moving all extremities. No gross focal neurologic deficits are appreciated. Skin: Skin is warm, dry and intact. No rash noted. Psychiatric: Mood and affect are normal. Speech and behavior are normal.  ____________________________________________   LABS (all labs ordered are listed, but only abnormal results are displayed)  Labs Reviewed  CBC - Abnormal; Notable for the following components:      Result Value   RDW 11.3 (*)    All other components within normal limits  BASIC METABOLIC PANEL - Abnormal; Notable for the following components:   Glucose, Bld 115 (*)    All  other components within normal limits  D-DIMER, QUANTITATIVE - Abnormal; Notable for the following components:   D-Dimer, Quant 0.97 (*)    All other components within normal limits  RESP PANEL BY RT-PCR (FLU A&B, COVID) ARPGX2  TROPONIN I (HIGH SENSITIVITY)   ____________________________________________  EKG   ED ECG REPORT I, Nita Sickle, the attending physician, personally viewed and interpreted this ECG.  Normal sinus rhythm with a rate of 90, normal intervals, normal axis, no ST elevations or depressions.  Normal EKG. ____________________________________________  RADIOLOGY  I have personally reviewed the images performed during this visit and I agree with the Radiologist's read.   Interpretation by Radiologist:  DG Chest 1 View  Result Date: 08/04/2021 CLINICAL DATA:  Pneumonia. EXAM: CHEST  1 VIEW COMPARISON:  Chest radiograph dated 07/23/2021. FINDINGS: No focal consolidation, pleural effusion, or pneumothorax. The cardiac silhouette is within normal limits. No acute osseous pathology. IMPRESSION: No active disease. Electronically Signed   By: Elgie Collard M.D.   On: 08/04/2021 19:52   CT Angio Chest PE W and/or Wo  Contrast  Result Date: 08/05/2021 CLINICAL DATA:  25 year old male with history of abdominal pain. Recent history of pneumonia. Positive D-dimer. Evaluate for pulmonary embolism. EXAM: CT ANGIOGRAPHY CHEST WITH CONTRAST TECHNIQUE: Multidetector CT imaging of the chest was performed using the standard protocol during bolus administration of intravenous contrast. Multiplanar CT image reconstructions and MIPs were obtained to evaluate the vascular anatomy. CONTRAST:  59mL OMNIPAQUE IOHEXOL 350 MG/ML SOLN COMPARISON:  Chest CTA 07/23/2021. FINDINGS: Cardiovascular: No filling defects within the pulmonary arterial tree to suggest pulmonary embolism. Heart size is normal. There is no significant pericardial fluid, thickening or pericardial calcification. No atherosclerotic calcifications in the thoracic aorta or the coronary arteries. Mediastinum/Nodes: No pathologically enlarged mediastinal or hilar lymph nodes. Esophagus is unremarkable in appearance. No axillary lymphadenopathy. Lungs/Pleura: No acute consolidative airspace disease. No pleural effusions. No suspicious appearing pulmonary nodules or masses are noted. Upper Abdomen: Unremarkable. Musculoskeletal: There are no aggressive appearing lytic or blastic lesions noted in the visualized portions of the skeleton. Review of the MIP images confirms the above findings. IMPRESSION: 1. No acute findings to account for the patient's symptoms. Specifically, no evidence of pulmonary embolism. Electronically Signed   By: Trudie Reed M.D.   On: 08/05/2021 04:28     ____________________________________________   PROCEDURES  Procedure(s) performed: None Procedures   Critical Care performed: yes  CRITICAL CARE Performed by: Nita Sickle  ?  Total critical care time: 30 min  Critical care time was exclusive of separately billable procedures and treating other patients.  Critical care was necessary to treat or prevent imminent or  life-threatening deterioration.  Critical care was time spent personally by me on the following activities: development of treatment plan with patient and/or surrogate as well as nursing, discussions with consultants, evaluation of patient's response to treatment, examination of patient, obtaining history from patient or surrogate, ordering and performing treatments and interventions, ordering and review of laboratory studies, ordering and review of radiographic studies, pulse oximetry and re-evaluation of patient's condition.  ____________________________________________   INITIAL IMPRESSION / ASSESSMENT AND PLAN / ED COURSE  25 y.o. male with a history of asthma, currently POD 22 for open tibia/fibula fracture repair who presents for evaluation of chest tightness and dry cough x 2 weeks.  Patient is well-appearing in no distress with normal vital signs, normal work of breathing and normal sats, lungs are clear to auscultation with good air movement bilaterally.  Initially was  tachycardic with a pulse of 109 after using his albuterol inhaler prior to arrival.  Patient had a CT 2 weeks ago of his chest which did not show PE or pneumonia.  He denies any changes in his symptoms and reports that they have just been constant.  He has had intermittent chest tightness that is responsive to albuterol.  He denies any pleuritic chest pain or hemoptysis.  He denies any fever or shortness of breath.  His chest x-ray here is clean with no signs of pneumonia or edema.  Labs show no leukocytosis, no anemia, no thrombocytosis, normal chemistry panel.  EKG and troponin with no signs of demand ischemia, pericarditis or myocarditis.  Due to recent surgery and initial tachycardia on arrival D-dimer was sent and is pending although doubt PE with intermittent symptoms and patient being on Xarelto.  Most likely bronchitis causing some bronchospasm that is responsive to albuterol.  Recommended continuing the full course of  prednisone and doxycycline and continue to use inhalers.  History is gathered from patient and his mother who is at bedside.  Plan discussed with both of them.  Records reviewed including notes from patient's surgeon and a CT that was done 2 weeks ago   _________________________ 4:34 AM on 08/05/2021 ----------------------------------------- D-dimer is elevated therefore patient was sent for a CT angio of the chest which is negative for PE, pneumonia, or any other acute findings.  Patient's presentation most likely consistent with bronchitis.  Recommended continuing therapies provided by outside hospital and close follow-up with PCP.  Discussed my standard return precautions with patient and his mother      _____________________________________________ Please note:  Patient was evaluated in Emergency Department today for the symptoms described in the history of present illness. Patient was evaluated in the context of the global COVID-19 pandemic, which necessitated consideration that the patient might be at risk for infection with the SARS-CoV-2 virus that causes COVID-19. Institutional protocols and algorithms that pertain to the evaluation of patients at risk for COVID-19 are in a state of rapid change based on information released by regulatory bodies including the CDC and federal and state organizations. These policies and algorithms were followed during the patient's care in the ED.  Some ED evaluations and interventions may be delayed as a result of limited staffing during the pandemic.   [place] Controlled Substance Database was reviewed by me. ____________________________________________   FINAL CLINICAL IMPRESSION(S) / ED DIAGNOSES   Final diagnoses:  Bronchitis      NEW MEDICATIONS STARTED DURING THIS VISIT:  ED Discharge Orders     None        Note:  This document was prepared using Dragon voice recognition software and may include unintentional dictation errors.     Nita Sickle, MD 08/05/21 [phone]

## 2021-08-09 ENCOUNTER — Other Ambulatory Visit: Payer: Self-pay

## 2021-08-10 MED ORDER — ONDANSETRON HCL 4 MG/2ML IJ SOLN
INTRAMUSCULAR | Status: AC
  Filled 2021-08-10: qty 2

## 2021-08-13 ENCOUNTER — Ambulatory Visit (INDEPENDENT_AMBULATORY_CARE_PROVIDER_SITE_OTHER): Payer: Self-pay | Admitting: Family Medicine

## 2021-08-13 ENCOUNTER — Encounter: Payer: Self-pay | Admitting: Family Medicine

## 2021-08-13 ENCOUNTER — Other Ambulatory Visit: Payer: Self-pay

## 2021-08-13 VITALS — BP 114/68 | HR 98 | Temp 97.5°F | Ht 68.0 in | Wt 144.1 lb

## 2021-08-13 DIAGNOSIS — R109 Unspecified abdominal pain: Secondary | ICD-10-CM

## 2021-08-13 DIAGNOSIS — R0789 Other chest pain: Secondary | ICD-10-CM

## 2021-08-13 DIAGNOSIS — S82202B Unspecified fracture of shaft of left tibia, initial encounter for open fracture type I or II: Secondary | ICD-10-CM

## 2021-08-13 DIAGNOSIS — R634 Abnormal weight loss: Secondary | ICD-10-CM

## 2021-08-13 NOTE — Patient Instructions (Addendum)
Start tapering down on oxycodone use - start at 1/2 pill as needed for breakthrough pain, but start taking tylenol 500mg  three times daily scheduled.  Continue bowel regimen (colace) while on oxycodone.  Heart and lungs sound clear today.  Incorporate protein supplement like boost or ensure 1/2 can-1 can daily for 1-2 weeks.  Return in 4-6 weeks for follow up visit.  Avoid gas producing foods (beans, onions, celery, carrots, raisins, bananas, apricots, prunes, brussel sprouts, wheat germ, pretzels) Try voltaren gel to tender area on chest (possible costochondritis)

## 2021-08-13 NOTE — Progress Notes (Signed)
Patient ID: Christopher Pittman, male    DOB: Mar 02, 1996, 25 y.o.   MRN: [id number]  This visit was conducted in person.  BP 114/68   Pulse 98   Temp (!) 97.5 F (36.4 C) (Temporal)   Ht 5\' 8"  (1.727 m)   Wt 144 lb 2 oz (65.4 kg) Comment: Wearing boot  SpO2 97%   BMI 21.91 kg/m    CC: new pt to establish care Subjective:   HPI: Christopher Pittman is a 25 y.o. male presenting on 08/13/2021 for Hospitalization Follow-up (Seen on 08/05/21 at [facility] ED, dx bronchitis.  Pt accompanied by mom, Kim- temp 97.9.)   Here with mom [facility] who is also my patient.   Recently post op from ORIF (07/14/2021) with rod and screws for tib/fib fracture sustained during accident at work (09/13/2021). Taking tylenol 500mg  BID PRN and oxycodone 5/325 for pain control, takes opiate about every other day. Also on methocarbamol for spasm.   1 wk after surgery developed chest tightness, dyspnea at rest, without significant cough. No fevers/chills, ear or tooth pain, ST, PNdrainage. No sick contacts at home.   Seen at ER x2 for concern for PNA presenting with chest tightness associated with dry cough. Follow up CTA chest x2 (done for elevated D dimer) negative for PE or pneumonia. Treated for bronchitis with amoxicillin course then broadened to doxycycline course. Also placed on prednisone and albuterol inhaler. Reassuring ER workup reviewed with patient. Didn't feel much benefit while on prednisone.   Most recently evaluated at [facility] in [facility]) this Monday - told rpt xray clear, told had bubbles in stomach, treated with naprosyn and GasX.   Since surgery, more sedentary than normal, has actually lost 10 lbs. Has immobilizing boot and walks with crutches. Previously very active at work.  Appetite has decreased since surgery.  Notes intermittent abdominal discomfort associated with gassiness.  No dysphagia, vomiting, early satiety.  He is staying well hydrated with water.  Doesn't feel  stressed at all.   H/o childhood asthma treated with PRN albuterol inhaler.      Relevant past medical, surgical, family and social history reviewed and updated as indicated. Interim medical history since our last visit reviewed. Allergies and medications reviewed and updated. Outpatient Medications Prior to Visit  Medication Sig Dispense Refill   acetaminophen (TYLENOL) 500 MG tablet Take 1 tablet (500 mg total) by mouth every 12 (twelve) hours. 60 tablet 0   cetirizine (ZYRTEC) 10 MG tablet Take 10 mg by mouth daily.     methocarbamol (ROBAXIN) 500 MG tablet Take 1-2 tablets (500-1,000 mg total) by mouth every 8 (eight) hours as needed for muscle spasms. 60 tablet 0   montelukast (SINGULAIR) 10 MG tablet Take 10 mg by mouth at bedtime.     multivitamin (ONE-A-DAY MEN'S) TABS tablet Take 1 tablet by mouth daily.     naproxen (NAPROSYN) 500 MG tablet Take 500 mg by mouth 2 (two) times daily.     oxyCODONE-acetaminophen (PERCOCET) 5-325 MG tablet Take 1-2 tablets by mouth every 6 (six) hours as needed for severe pain. 50 tablet 0   Rivaroxaban (XARELTO) 15 MG TABS tablet Take 1 tablet (15 mg total) by mouth daily with supper. 30 tablet 0   simethicone (MYLICON) 125 MG chewable tablet Chew 125 mg by mouth every 6 (six) hours as needed for flatulence.     vitamin C (ASCORBIC ACID) 500 MG tablet Take 500 mg by mouth daily.  Cholecalciferol 125 MCG (5000 UT) TABS Take 1 tablet (5,000 Units total) by mouth daily. (Patient not taking: Reported on 08/13/2021) 30 tablet 6   docusate sodium (COLACE) 100 MG capsule Take 1 capsule (100 mg total) by mouth daily. 20 capsule 0   ketorolac (TORADOL) 10 MG tablet Take 1 tablet (10 mg total) by mouth every 6 (six) hours as needed for moderate pain. 16 tablet 0   No facility-administered medications prior to visit.     Per HPI unless specifically indicated in ROS section below Review of Systems  Objective:  BP 114/68   Pulse 98   Temp (!) 97.5 F  (36.4 C) (Temporal)   Ht 5\' 8"  (1.727 m)   Wt 144 lb 2 oz (65.4 kg) Comment: Wearing boot  SpO2 97%   BMI 21.91 kg/m   Wt Readings from Last 3 Encounters:  08/13/21 144 lb 2 oz (65.4 kg)  07/23/21 154 lb 15.7 oz (70.3 kg)  07/14/21 155 lb (70.3 kg)      Physical Exam Vitals and nursing note reviewed.  Constitutional:      Appearance: Normal appearance. He is not ill-appearing.     Comments: Ambulates with crutches, non-weight bearing to LLE  HENT:     Head: Normocephalic and atraumatic.  Eyes:     Extraocular Movements: Extraocular movements intact.     Pupils: Pupils are equal, round, and reactive to light.  Cardiovascular:     Rate and Rhythm: Normal rate and regular rhythm.     Pulses: Normal pulses.     Heart sounds: Normal heart sounds. No murmur heard. Pulmonary:     Effort: Pulmonary effort is normal. No respiratory distress.     Breath sounds: Normal breath sounds. No wheezing, rhonchi or rales.  Chest:     Chest wall: Tenderness (L 2nd costochondral junction) present.  Abdominal:     General: Bowel sounds are normal. There is no distension.     Palpations: Abdomen is soft. There is no mass.     Tenderness: There is no abdominal tenderness. There is no right CVA tenderness, left CVA tenderness, guarding or rebound.     Hernia: No hernia is present.  Musculoskeletal:     Right lower leg: No edema.     Comments: LLE with boot in place  Skin:    General: Skin is warm and dry.     Findings: No rash.  Neurological:     Mental Status: He is alert.  Psychiatric:        Mood and Affect: Mood normal.        Behavior: Behavior normal.      Results for orders placed or performed during the hospital encounter of 08/05/21  Resp Panel by RT-PCR (Flu A&B, Covid) Nasopharyngeal Swab   Specimen: Nasopharyngeal Swab; Nasopharyngeal(NP) swabs in vial transport medium  Result Value Ref Range   SARS Coronavirus 2 by RT PCR NEGATIVE NEGATIVE   Influenza A by PCR NEGATIVE  NEGATIVE   Influenza B by PCR NEGATIVE NEGATIVE  CBC  Result Value Ref Range   WBC 9.0 4.0 - 10.5 K/uL   RBC 4.53 4.22 - 5.81 MIL/uL   Hemoglobin 14.4 13.0 - 17.0 g/dL   HCT 08/07/21 32.4 - 40.1 %   MCV 90.5 80.0 - 100.0 fL   MCH 31.8 26.0 - 34.0 pg   MCHC 35.1 30.0 - 36.0 g/dL   RDW 02.7 (L) 25.3 - 66.4 %   Platelets 337 150 - 400 K/uL  nRBC 0.0 0.0 - 0.2 %  Basic metabolic panel  Result Value Ref Range   Sodium 135 135 - 145 mmol/L   Potassium 4.1 3.5 - 5.1 mmol/L   Chloride 99 98 - 111 mmol/L   CO2 27 22 - 32 mmol/L   Glucose, Bld 115 (H) 70 - 99 mg/dL   BUN 15 6 - 20 mg/dL   Creatinine, Ser 5.02 0.61 - 1.24 mg/dL   Calcium 9.6 8.9 - 77.4 mg/dL   GFR, Estimated >12 >87 mL/min   Anion gap 9 5 - 15  D-dimer, quantitative  Result Value Ref Range   D-Dimer, Quant 0.97 (H) 0.00 - 0.50 ug/mL-FEU  Troponin I (High Sensitivity)  Result Value Ref Range   Troponin I (High Sensitivity) 2 <18 ng/L    Assessment & Plan:  This visit occurred during the SARS-CoV-2 public health emergency.  Safety protocols were in place, including screening questions prior to the visit, additional usage of staff PPE, and extensive cleaning of exam room while observing appropriate contact time as indicated for disinfecting solutions.   Problem List Items Addressed This Visit     Open left tibial fracture    S/p ORIF 07/14/2021, following with ortho with next appt on 08/27/2021. Hopeful to be able to start weight bearing at that time, anticipate increased activity will help symtpoms.       Chest discomfort - Primary    Chest discomfort associated with dyspnea after orthopedic surgery earlier this month, has been evaluated several times including 2 chest CTAs negative for PE or infection. On exam he does have evidence of possible left sided costochondritis - rec gentle stretching and voltaren gel to tender area. Otherwise reassuring exam and vitals today. Will continue monitoring this.  Also discussed  limiting gas producing foods.  I did ask him to return in 4-6 wks for f/u visit.       Abdominal discomfort    May have started after opiate commencement - he's still regularly taking opiate 1 month after leg surgery - anticipate possible side effect to drug. Discussed tapering oxycodone and instead scheduling tylenol for discomfort, discussed need for ongoing bowel regimen while on opiate. Update if not improving with this.       Unintentional weight loss    10 lb weight loss since ORIF of humeral fracture.  Endorses poor appetite. Discussed starting boost or ensure supplementation over the next 2 weeks, discussed tapering opiate.  Recheck in 4-6 wks.         No orders of the defined types were placed in this encounter.  No orders of the defined types were placed in this encounter.    Patient Instructions  Start tapering down on oxycodone use - start at 1/2 pill as needed for breakthrough pain, but start taking tylenol 500mg  three times daily scheduled.  Continue bowel regimen (colace) while on oxycodone.  Heart and lungs sound clear today.  Incorporate protein supplement like boost or ensure 1/2 can-1 can daily for 1-2 weeks.  Return in 4-6 weeks for follow up visit.  Avoid gas producing foods (beans, onions, celery, carrots, raisins, bananas, apricots, prunes, brussel sprouts, wheat germ, pretzels) Try voltaren gel to tender area on chest (possible costochondritis)  Follow up plan: Return in about 4 weeks (around 09/10/2021), or if symptoms worsen or fail to improve, for follow up visit.  09/12/2021, MD

## 2021-08-14 DIAGNOSIS — M940 Chondrocostal junction syndrome [Tietze]: Secondary | ICD-10-CM

## 2021-08-14 DIAGNOSIS — R109 Unspecified abdominal pain: Secondary | ICD-10-CM | POA: Insufficient documentation

## 2021-08-14 DIAGNOSIS — R634 Abnormal weight loss: Secondary | ICD-10-CM | POA: Insufficient documentation

## 2021-08-14 DIAGNOSIS — R0789 Other chest pain: Secondary | ICD-10-CM | POA: Insufficient documentation

## 2021-08-14 HISTORY — DX: Abnormal weight loss: R63.4

## 2021-08-14 HISTORY — DX: Chondrocostal junction syndrome (tietze): M94.0

## 2021-08-14 NOTE — Assessment & Plan Note (Signed)
May have started after opiate commencement - he's still regularly taking opiate 1 month after leg surgery - anticipate possible side effect to drug. Discussed tapering oxycodone and instead scheduling tylenol for discomfort, discussed need for ongoing bowel regimen while on opiate. Update if not improving with this.

## 2021-08-14 NOTE — Assessment & Plan Note (Signed)
S/p ORIF 07/14/2021, following with ortho with next appt on 08/27/2021. Hopeful to be able to start weight bearing at that time, anticipate increased activity will help symtpoms.

## 2021-08-14 NOTE — Assessment & Plan Note (Addendum)
Chest discomfort associated with dyspnea after orthopedic surgery earlier this month, has been evaluated several times including 2 chest CTAs negative for PE or infection. On exam he does have evidence of possible left sided costochondritis - rec gentle stretching and voltaren gel to tender area. Otherwise reassuring exam and vitals today. Will continue monitoring this.  Also discussed limiting gas producing foods.  I did ask him to return in 4-6 wks for f/u visit.

## 2021-08-14 NOTE — Assessment & Plan Note (Signed)
10 lb weight loss since ORIF of humeral fracture.  Endorses poor appetite. Discussed starting boost or ensure supplementation over the next 2 weeks, discussed tapering opiate.  Recheck in 4-6 wks.

## 2021-09-01 ENCOUNTER — Telehealth: Payer: Self-pay | Admitting: Family Medicine

## 2021-09-01 MED ORDER — OMEPRAZOLE 20 MG PO CPDR: 20.0000 mg | DELAYED_RELEASE_CAPSULE | Freq: Every day | ORAL | 1 refills | Status: DC

## 2021-09-01 NOTE — Telephone Encounter (Signed)
Pt calling back requesting rx for omeprazole 20 mg daily be sent to [facility] due to worker's comp.

## 2021-09-01 NOTE — Telephone Encounter (Signed)
How is he using oxycodone and is he taking colace? Are bowels regular? Any diarrhea or constipation?  Christopher Pittman is usually best for gassiness. May have him try anti-reflux OTC omeprazole 20mg  daily for 7 days and update with effect.

## 2021-09-01 NOTE — Telephone Encounter (Signed)
Pt called wanting to know if he could have something called in to help with the gas build up

## 2021-09-01 NOTE — Addendum Note (Signed)
Addended by: Eustaquio Boyden on: 09/01/2021 05:01 PM   Modules accepted: Orders

## 2021-09-01 NOTE — Telephone Encounter (Signed)
Spoke with pt asking Dr. Timoteo Expose questions.  Pt states he has stopped oxycodone.  Taking Tylenol and Phazyen 500 mg.  Says bowels are regular.  Started Miralax.  Denies diarrhea/constipation.  I relayed Dr. Timoteo Expose message.  Pt verbalizes understanding and will try OTC omeprazole.

## 2021-09-02 NOTE — Op Note (Signed)
09/02/2021 11:41 AM  PATIENT:  Christopher Pittman 25 y.o.   DATE OF BIRTH: 1996/07/24  MEDICAL RECORD NUMBER: [id number]  PRE-OPERATIVE DIAGNOSIS:  TYPE 2 OPEN COMMINUTED LEFT TIBIAL SHAFT FRACTURE   POST-OPERATIVE DIAGNOSIS:  TYPE 2 OPEN COMMINUTED LEFT TIBIAL SHAFT FRACTURE  PROCEDURE:  Procedure(s): DEBRIDEMENT OF OPEN FRACTURE INCLUDING REMOVAL OF BONE 2.   INTRAMEDULLARY NAILING OF THE LEFT TIBIAL SHAFT FRACTURE FIXATION (Left) with SYNTHES 9 x 375 mm, statically locked  SURGEON:  Surgeon(s) and Role:    Myrene Galas, MD - Primary  ASSISTANTS: Montez Morita, PA-C  ANESTHESIA:   none  EBL:  Minimal   BLOOD ADMINISTERED: None  DRAINS: None   LOCAL MEDICATIONS USED:  NONE  SPECIMEN:  No Specimen  DISPOSITION OF SPECIMEN:  N/A  COUNTS:  YES  TOURNIQUET:  * No tourniquets in log *  DICTATION: .Note written in EPIC  PLAN OF CARE: Admit to inpatient   PATIENT DISPOSITION:  PACU - hemodynamically stable.   Delay start of Pharmacological VTE agent (>24hrs) due to surgical blood loss or risk of bleeding: no  BRIEF SUMMARY AND INDICATIONS FOR PROCEDURE:  Christopher Pittman is a 25 y.o. who sustained a left tibia fracture from fork lift crush injury with open fracture confirmed by CT scan. Patient denied increasing pain or paresthesia. I also discussed with the patient the risks and benefits of surgery, including the possibility of infection, nerve injury, vessel injury, wound breakdown, arthritis, symptomatic hardware, DVT/ PE, loss of motion, malunion, nonunion, heart attack, stroke, prolonged intubation, and need for further surgery among others. These risks were acknowledged and consent given to proceed.  BRIEF SUMMARY OF PROCEDURE:  The patient was taken to the operating room after administration of Ancef for antibiotics.  The operative extremity was prepped and draped in the usual fashion.  No tourniquet was used during the procedure.    I began with the open wound,  extending distal and proximal limbs vertically, which revealed the true area of stripping to be 2 cm with some small free pieces of cortical bone . This enabled me to expose the bone ends. Using the scalpel, I sharply excised injured skin, subcutaneous tissue, and muscle. I also removed contaminated free cortical bone segments devoid of soft tissue attachment and then the curettes to additionally excise contaminated cancellous bone. 6000 cc of saline with the pulsavac where then flushed through the exposed bone ends and wound, with my assistant helping to deliver the bone ends and supplementing with soap wash and rinse, as well.  Once the debridement was completed, we turned our attention to fracture repair.   A 2.5-cm incision was made at the base of the distal pole of patella and extended proximally. A medial parapatellar incision was made, and then the curved cannulated awl advanced into the center of the proximal tibia just medial to the lateral tibial spine and just anterior to the joint surface.  A guidewire was then advanced across the fracture site into the middle of the plafond and checked on AP and LAT images, measuring for nail length on the lateral.  We then performed sequential reaming, encountering chatter at 9 mm, reaming up to 10 mm and placing a 9 x 375 mm nail. We were careful to watch alignment throughout and make sure distal locking bolts were anterior to the fibula. After placing both the distal locks, back slapping was performed to interdigitate the fracture, which resulted in an excellent reduction. Two proximal locks were placed off the jig  and checked for position and length.  An assistant was required for the procedure as my assistant performed the reaming and proximal instrumentation while I held reduction. Standard layered closure was performed. Montez Morita, PA-C assisted during reaming and nail placement, as well as wound closure.  The patient was taken to the PACU in stable  condition after application of sterile gently compressive dressings.  PROGNOSIS:  The patient will be weightbearing as tolerated with unrestricted motion of the knee and ankle for the next 6 weeks. CAM boot for support as needed. Xarelto for DVT prophylaxis. F/u in the office in 10-14 days for removal of sutures.     Doralee Albino. Carola Frost, M.D.

## 2021-09-08 ENCOUNTER — Telehealth: Payer: Self-pay | Admitting: Family Medicine

## 2021-09-08 MED ORDER — MONTELUKAST SODIUM 10 MG PO TABS: 10.0000 mg | ORAL_TABLET | Freq: Every day | ORAL | 11 refills | Status: DC

## 2021-09-08 NOTE — Telephone Encounter (Signed)
Pt needs a refill on montelukast (SINGULAIR) 10 MG tablet sent to [facility] This would be the first time Dr.G is prescribing

## 2021-09-08 NOTE — Telephone Encounter (Addendum)
Refilled

## 2021-09-08 NOTE — Addendum Note (Signed)
Addended by: Eustaquio Boyden on: 09/08/2021 05:04 PM   Modules accepted: Orders

## 2021-09-09 ENCOUNTER — Telehealth: Payer: Self-pay | Admitting: Family Medicine

## 2021-09-09 MED ORDER — SUCRALFATE 1 G PO TABS: 1.0000 g | ORAL_TABLET | Freq: Three times a day (TID) | ORAL | 0 refills | Status: DC

## 2021-09-09 NOTE — Telephone Encounter (Signed)
Attempted to contact pt.  Vm is full.  Need to get answers to Dr. Timoteo Expose questions and relay message.

## 2021-09-09 NOTE — Telephone Encounter (Signed)
Spoke with pt asking about sxs.  States he's still having chest/abd discomfort.  Meds are not helping.  Has 4 wk f/u on 09/17/21.  Plz advise.

## 2021-09-09 NOTE — Telephone Encounter (Addendum)
He's had several unrevealing xrays and CT scans, don't think he needs another xray. Is he weight bearing now? Is weight picking up? Recommend gentle stretching of chest wall for presumed costochondritis.  Would offer trial carafate - may take 2-3 times daily with meals it is a medicine that coats the stomach, update [place] with effect. [facility] Recommend keep f/u appt next week and we will reassess then.

## 2021-09-09 NOTE — Addendum Note (Signed)
Addended by: Eustaquio Boyden on: 09/09/2021 05:05 PM   Modules accepted: Orders

## 2021-09-09 NOTE — Telephone Encounter (Signed)
Pt has called stating he is still feeling some discomfort and he would like to schedule an xray.  Please advise

## 2021-09-10 NOTE — Telephone Encounter (Signed)
Attempted to contact pt.  Vm is full.  Need to get answers to Dr. Timoteo Expose questions and relay message.

## 2021-09-11 ENCOUNTER — Telehealth: Payer: Self-pay | Admitting: Family Medicine

## 2021-09-11 DIAGNOSIS — R1310 Dysphagia, unspecified: Secondary | ICD-10-CM

## 2021-09-11 DIAGNOSIS — R109 Unspecified abdominal pain: Secondary | ICD-10-CM

## 2021-09-11 NOTE — Telephone Encounter (Signed)
Attempted to contact pt.  Vm box is full.  Need to inform pt sucralfate is a type of antacid.  Nees to take 1 tablet by mouth 3 times daily before meals.  Hopefully this will help with sxs.

## 2021-09-11 NOTE — Telephone Encounter (Signed)
Mr. Simonetti called in and stated that he has a question about why he was given the medication  sucralfate (CARAFATE) 1 g tablet

## 2021-09-12 NOTE — Telephone Encounter (Signed)
Sent mychart message

## 2021-09-12 NOTE — Telephone Encounter (Signed)
Attempted to contact pt.  Vm box is full.  Need to inform pt sucralfate is a type of antacid.  Nees to take 1 tablet by mouth 3 times daily before meals.  Hopefully this will help with sxs.

## 2021-09-12 NOTE — Telephone Encounter (Signed)
Pt's mom, Selena Batten (not on dpr), calling really concerned about pt.  Says he is REALLY uncomfortable.  So much so that he hasn't eaten today.  States the med sent in yesterday bother his stomach so much he hasn't taken anymore.  Says pt has been miserable since leg surgery.  Requesting some imaging be ordered to see what's going on.  Informed Kim I am sending message to Dr. Reece Agar and that we have been trying to contact Apolinar Junes and that we cannot tell her anything since she's not on his info.  Verbalizes understanding and she will try to call him to let him know but that he also uses MyChart and would probably check that first.  Plz advise.

## 2021-09-12 NOTE — Telephone Encounter (Signed)
See phn note, 09/11/21.

## 2021-09-17 ENCOUNTER — Ambulatory Visit (INDEPENDENT_AMBULATORY_CARE_PROVIDER_SITE_OTHER): Payer: BC Managed Care – PPO | Admitting: Family Medicine

## 2021-09-17 ENCOUNTER — Encounter: Payer: Self-pay | Admitting: Family Medicine

## 2021-09-17 ENCOUNTER — Other Ambulatory Visit: Payer: Self-pay

## 2021-09-17 VITALS — BP 112/74 | HR 80 | Temp 97.7°F | Ht 68.0 in | Wt 152.1 lb

## 2021-09-17 DIAGNOSIS — M940 Chondrocostal junction syndrome [Tietze]: Secondary | ICD-10-CM

## 2021-09-17 DIAGNOSIS — R109 Unspecified abdominal pain: Secondary | ICD-10-CM | POA: Diagnosis not present

## 2021-09-17 DIAGNOSIS — R1310 Dysphagia, unspecified: Secondary | ICD-10-CM | POA: Diagnosis not present

## 2021-09-17 MED ORDER — ALUM & MAG HYDROXIDE-SIMETH 200-200-20 MG/5ML PO SUSP
15.0000 mL | Freq: Once | ORAL | Status: AC
  Administered 2021-09-17: 15 mL via ORAL

## 2021-09-17 NOTE — Assessment & Plan Note (Signed)
Describes ongoing solid dysphagia over the last 2 months that started after ortho surgery. Would have expected throat irritation from intubation to have resolved by now. Will proceed with swallow eval and if unrevealing, discussed GI referral.

## 2021-09-17 NOTE — Progress Notes (Signed)
Patient ID: Christopher Pittman, male    DOB: 1996-03-23, 23 y.o.   MRN: [id number]  This visit was conducted in person.  BP 112/74   Pulse 80   Temp 97.7 F (36.5 C) (Temporal)   Ht 5\' 8"  (1.727 m)   Wt 152 lb 1 oz (69 kg)   SpO2 100%   BMI 23.12 kg/m    CC: f/u chest pain  Subjective:   HPI: Christopher Pittman is a 25 y.o. male presenting on 09/17/2021 for Follow-up (Here for 4 wk f/u.  Accompanied by mom, 14/04/2021. )   See prior note for details.  Recent ORIF (07/2021) for L tib/fib fracture sustained at work. Current pain regimen is tylenol 500mg  qhs. Now off oxycodone.   Ongoing GI upset worse with food  Carafate tablet may have caused worsening abdominal pain, trouble bending over after he took this. Last pepcid dose was yesterday morning, last prilosec was last week (this seemed to worsen symptoms), last carafate was 1 wk ago as well.   Notes solid food dysphagia every time he eats since surgery as well as early satiety.  Has never had GI issues previously.   No h/o food intolerances. No bowel changes.  No nausea/vomiting.   Ongoing chest pain described as aching pain to anterior mid chest with radiation to bilateral upper chest wall - topical voltaren didn't seem to help.   Intermittent headaches started 1.5 wks ago - describes frontal pressure that leads to sore ache/throbbing throughout head. Tylenol does help. Tried sudafed with some benefit. No vision changes, dizziness, no significant sinus congestion.   Known allergic rhinitis treats with singulair and zyrtec - usually symptoms worse in the summers.   Bought home scale - 145lbs this morning.      Relevant past medical, surgical, family and social history reviewed and updated as indicated. Interim medical history since our last visit reviewed. Allergies and medications reviewed and updated. Outpatient Medications Prior to Visit  Medication Sig Dispense Refill   acetaminophen (TYLENOL) 500 MG tablet Take 1 tablet (500 mg  total) by mouth every 12 (twelve) hours. 60 tablet 0   cetirizine (ZYRTEC) 10 MG tablet Take 10 mg by mouth daily.     montelukast (SINGULAIR) 10 MG tablet Take 1 tablet (10 mg total) by mouth at bedtime. 30 tablet 11   multivitamin (ONE-A-DAY MEN'S) TABS tablet Take 1 tablet by mouth daily.     vitamin C (ASCORBIC ACID) 500 MG tablet Take 500 mg by mouth daily.     simethicone (MYLICON) 125 MG chewable tablet Chew 125 mg by mouth every 6 (six) hours as needed for flatulence. (Patient not taking: Reported on 09/17/2021)     Cholecalciferol 125 MCG (5000 UT) TABS Take 1 tablet (5,000 Units total) by mouth daily. 30 tablet 6   methocarbamol (ROBAXIN) 500 MG tablet Take 1-2 tablets (500-1,000 mg total) by mouth every 8 (eight) hours as needed for muscle spasms. 60 tablet 0   naproxen (NAPROSYN) 500 MG tablet Take 500 mg by mouth 2 (two) times daily.     omeprazole (PRILOSEC) 20 MG capsule Take 1 capsule (20 mg total) by mouth daily. For 3 weeks then daily as needed for reflux 30 capsule 1   oxyCODONE-acetaminophen (PERCOCET) 5-325 MG tablet Take 1-2 tablets by mouth every 6 (six) hours as needed for severe pain. 50 tablet 0   Rivaroxaban (XARELTO) 15 MG TABS tablet Take 1 tablet (15 mg total) by mouth daily with supper. 30 tablet 0  sucralfate (CARAFATE) 1 g tablet Take 1 tablet (1 g total) by mouth 3 (three) times daily before meals. As needed (Patient not taking: Reported on 09/17/2021) 30 tablet 0   No facility-administered medications prior to visit.     Per HPI unless specifically indicated in ROS section below Review of Systems  Objective:  BP 112/74   Pulse 80   Temp 97.7 F (36.5 C) (Temporal)   Ht 5\' 8"  (1.727 m)   Wt 152 lb 1 oz (69 kg)   SpO2 100%   BMI 23.12 kg/m   Wt Readings from Last 3 Encounters:  09/17/21 152 lb 1 oz (69 kg)  08/13/21 144 lb 2 oz (65.4 kg)  07/23/21 154 lb 15.7 oz (70.3 kg)      Physical Exam Vitals and nursing note reviewed.  Constitutional:       Appearance: Normal appearance. He is not ill-appearing.  HENT:     Head: Normocephalic and atraumatic.     Mouth/Throat:     Mouth: Mucous membranes are moist.     Pharynx: Oropharynx is clear. No oropharyngeal exudate or posterior oropharyngeal erythema.  Eyes:     Extraocular Movements: Extraocular movements intact.     Conjunctiva/sclera: Conjunctivae normal.     Pupils: Pupils are equal, round, and reactive to light.  Cardiovascular:     Rate and Rhythm: Normal rate and regular rhythm.     Pulses: Normal pulses.     Heart sounds: Normal heart sounds. No murmur heard. Pulmonary:     Effort: Pulmonary effort is normal. No respiratory distress.     Breath sounds: Normal breath sounds. No wheezing, rhonchi or rales.  Chest:     Chest wall: Tenderness (reproducible tenderness to 2nd costochondral junction) present.  Abdominal:     General: Abdomen is flat. Bowel sounds are normal. There is no distension.     Palpations: Abdomen is soft. There is no mass.     Tenderness: There is no abdominal tenderness. There is no right CVA tenderness, left CVA tenderness, guarding or rebound.     Hernia: No hernia is present.  Musculoskeletal:     Right lower leg: No edema.     Left lower leg: No edema.     Comments:  Ambulates with crutches CAM walker boot on L foot  Skin:    General: Skin is warm and dry.     Findings: No rash.  Neurological:     Mental Status: He is alert.  Psychiatric:        Mood and Affect: Mood normal.        Behavior: Behavior normal.      Results for orders placed or performed during the hospital encounter of 08/05/21  Resp Panel by RT-PCR (Flu A&B, Covid) Nasopharyngeal Swab   Specimen: Nasopharyngeal Swab; Nasopharyngeal(NP) swabs in vial transport medium  Result Value Ref Range   SARS Coronavirus 2 by RT PCR NEGATIVE NEGATIVE   Influenza A by PCR NEGATIVE NEGATIVE   Influenza B by PCR NEGATIVE NEGATIVE  CBC  Result Value Ref Range   WBC 9.0 4.0 - 10.5  K/uL   RBC 4.53 4.22 - 5.81 MIL/uL   Hemoglobin 14.4 13.0 - 17.0 g/dL   HCT 08/07/21 35.5 - 73.2 %   MCV 90.5 80.0 - 100.0 fL   MCH 31.8 26.0 - 34.0 pg   MCHC 35.1 30.0 - 36.0 g/dL   RDW 20.2 (L) 54.2 - 70.6 %   Platelets 337 150 - 400 K/uL  nRBC 0.0 0.0 - 0.2 %  Basic metabolic panel  Result Value Ref Range   Sodium 135 135 - 145 mmol/L   Potassium 4.1 3.5 - 5.1 mmol/L   Chloride 99 98 - 111 mmol/L   CO2 27 22 - 32 mmol/L   Glucose, Bld 115 (H) 70 - 99 mg/dL   BUN 15 6 - 20 mg/dL   Creatinine, Ser 4.03 0.61 - 1.24 mg/dL   Calcium 9.6 8.9 - 47.4 mg/dL   GFR, Estimated >25 >95 mL/min   Anion gap 9 5 - 15  D-dimer, quantitative  Result Value Ref Range   D-Dimer, Quant 0.97 (H) 0.00 - 0.50 ug/mL-FEU  Troponin I (High Sensitivity)  Result Value Ref Range   Troponin I (High Sensitivity) 2 <18 ng/L    Assessment & Plan:  This visit occurred during the SARS-CoV-2 public health emergency.  Safety protocols were in place, including screening questions prior to the visit, additional usage of staff PPE, and extensive cleaning of exam room while observing appropriate contact time as indicated for disinfecting solutions.   Problem List Items Addressed This Visit     Costochondritis    Ongoing, exam still consistent with costochondritis.  Continue regular topical voltaren, regular gentle stretching of chest wall, ice/heat. Avoid oral NSAIDs due to GI symptoms below.       Abdominal discomfort - Primary    Ongoing abdominal discomfort described as fullness sensation after all meals associated with dysphagia. Anticipate component of esophagitis or gastritis, he has had several antibiotic courses recently (amoxicillin then doxycycline).  Will check stool for H pylori Ag, recommend start probiotic daily after recent antibiotic course.  Will order barium swallow for dysphagia.  If unrevealing/ongoing symptoms, discussed GI evaluation as next step.  Provided in office today with maalox -  without significant change in symptoms.       Relevant Orders   Helicobacter pylori special antigen   Dysphagia    Describes ongoing solid dysphagia over the last 2 months that started after ortho surgery. Would have expected throat irritation from intubation to have resolved by now. Will proceed with swallow eval and if unrevealing, discussed GI referral.       Relevant Orders   DG ESOPHAGUS W DOUBLE CM (HD)     Meds ordered this encounter  Medications   alum & mag hydroxide-simeth (MAALOX/MYLANTA) 200-200-20 MG/5ML suspension 15 mL    Orders Placed This Encounter  Procedures   Helicobacter pylori special antigen    Standing Status:   Future    Standing Expiration Date:   09/17/2022   DG ESOPHAGUS W DOUBLE CM (HD)    Standing Status:   Future    Standing Expiration Date:   09/17/2022    Scheduling Instructions:     Patient requests [facility]    Order Specific Question:   Reason for Exam (SYMPTOM  OR DIAGNOSIS REQUIRED)    Answer:   dysphagia x 2 months since orthopedic surgery    Order Specific Question:   Preferred imaging location?    Answer:   [id number]    Order Specific Question:   Radiology Contrast Protocol - do NOT remove file path    Answer:   [email or web address]     Patient instructions: I think you have ongoing costochondritis - continue gentle stretching, ice/heat to the tender area (whichever soothes better), and topical voltaren gel 2-3 times daily for next several days to see if any improvement.  For stomach pain - stop omeprazole, sucralfate. Ok  to take pepcid. I will order swallow study and pending results may refer you to the gastroenterologist. I'd also like you to complete stool test looking for H pylori infection - but you have to be off omeprazole and sucralfate for at least 2 weeks prior to collecting test.   Follow up plan: No follow-ups on file.  Eustaquio Boyden, MD

## 2021-09-17 NOTE — Patient Instructions (Addendum)
I think you have ongoing costochondritis - continue gentle stretching, ice/heat to the tender area (whichever soothes better), and topical voltaren gel 2-3 times daily for next several days to see if any improvement.  For stomach pain - stop omeprazole, sucralfate. Ok to take pepcid as needed. I will order swallow study and pending results may refer you to the gastroenterologist. I'd also like you to complete stool test looking for H pylori infection - but you have to be off omeprazole and sucralfate for at least 2 weeks prior to collecting test.  Take daily probiotic like align or philips colon health for 2 weeks.   Costochondritis Costochondritis is inflammation of the tissue (cartilage) that connects the ribs to the breastbone (sternum). This causes pain in the front of the chest. The pain usually starts slowly and involves more than one rib. What are the causes? The exact cause of this condition is not always known. It results from stress on the cartilage where your ribs attach to your sternum. The cause of this stress could be: Chest injury. Exercise or activity, such as lifting. Severe coughing. What increases the risk? You are more likely to develop this condition if you: Are male. Are 50-13 years old. Recently started a new exercise or work activity. Have low levels of vitamin D. Have a condition that makes you cough frequently. What are the signs or symptoms? The main symptom of this condition is chest pain. The pain: Usually starts gradually and can be sharp or dull. Gets worse with deep breathing, coughing, or exercise. Gets better with rest. May be worse when you press on the affected area of your ribs and sternum. How is this diagnosed? This condition is diagnosed based on your symptoms, your medical history, and a physical exam. Your health care provider will check for pain when pressing on your sternum. You may also have tests to rule out other causes of chest pain. These  may include: A chest X-ray to check for lung problems. An ECG (electrocardiogram) to see if you have a heart problem that could be causing the pain. An imaging scan to rule out a chest or rib fracture. How is this treated? This condition usually goes away on its own over time. Your health care provider may prescribe an NSAID, such as ibuprofen, to reduce pain and inflammation. Treatment may also include: Resting and avoiding activities that make pain worse. Applying heat or ice to the area to reduce pain and inflammation. Doing exercises to stretch your chest muscles. If these treatments do not help, your health care provider may inject a numbing medicine at the sternum-rib connection to help relieve the pain. Follow these instructions at home: Managing pain, stiffness, and swelling   If directed, put ice on the painful area. To do this: Put ice in a plastic bag. Place a towel between your skin and the bag. Leave the ice on for 20 minutes, 2-3 times a day. If directed, apply heat to the affected area as often as told by your health care provider. Use the heat source that your health care provider recommends, such as a moist heat pack or a heating pad. Place a towel between your skin and the heat source. Leave the heat on for 20-30 minutes. Remove the heat if your skin turns bright red. This is especially important if you are unable to feel pain, heat, or cold. You may have a greater risk of getting burned. Activity Rest as told by your health care provider. Avoid  activities that make pain worse. This includes any activities that use chest, abdominal, and side muscles. Do not lift anything that is heavier than 10 lb (4.5 kg), or the limit that you are told, until your health care provider says that it is safe. Return to your normal activities as told by your health care provider. Ask your health care provider what activities are safe for you. General instructions Take over-the-counter and  prescription medicines only as told by your health care provider. Keep all follow-up visits as told by your health care provider. This is important. Contact a health care provider if: You have chills or a fever. Your pain does not go away or it gets worse. You have a cough that does not go away. Get help right away if: You have shortness of breath. You have severe chest pain that is not relieved by medicines, heat, or ice. These symptoms may represent a serious problem that is an emergency. Do not wait to see if the symptoms will go away. Get medical help right away. Call your local emergency services (911 in the [place] Do not drive yourself to the hospital.  Summary Costochondritis is inflammation of the tissue (cartilage) that connects the ribs to the breastbone (sternum). This condition causes pain in the front of the chest. Costochondritis results from stress on the cartilage where your ribs attach to your sternum. Treatment may include medicines, rest, heat or ice, and exercises. This information is not intended to replace advice given to you by your health care provider. Make sure you discuss any questions you have with your health care provider. Document Revised: 08/11/2019 Document Reviewed: 08/11/2019 Elsevier Patient Education  2022 [employer].

## 2021-09-17 NOTE — Assessment & Plan Note (Signed)
Ongoing, exam still consistent with costochondritis.  Continue regular topical voltaren, regular gentle stretching of chest wall, ice/heat. Avoid oral NSAIDs due to GI symptoms below.

## 2021-09-17 NOTE — Assessment & Plan Note (Addendum)
Ongoing abdominal discomfort described as fullness sensation after all meals associated with dysphagia. Anticipate component of esophagitis or gastritis, he has had several antibiotic courses recently (amoxicillin then doxycycline).  Will check stool for H pylori Ag, recommend start probiotic daily after recent antibiotic course.  Will order barium swallow for dysphagia.  If unrevealing/ongoing symptoms, discussed GI evaluation as next step.  Provided in office today with maalox - without significant change in symptoms.

## 2021-09-18 NOTE — Addendum Note (Signed)
Addended by: Eustaquio Boyden on: 09/18/2021 09:56 PM   Modules accepted: Orders

## 2021-09-29 ENCOUNTER — Other Ambulatory Visit: Payer: Self-pay

## 2021-09-30 ENCOUNTER — Encounter: Payer: Self-pay | Admitting: Gastroenterology

## 2021-09-30 ENCOUNTER — Ambulatory Visit (INDEPENDENT_AMBULATORY_CARE_PROVIDER_SITE_OTHER): Payer: BC Managed Care – PPO | Admitting: Gastroenterology

## 2021-09-30 ENCOUNTER — Other Ambulatory Visit: Payer: Self-pay | Admitting: Gastroenterology

## 2021-09-30 VITALS — BP 124/86 | HR 74 | Temp 98.7°F | Ht 68.0 in | Wt 148.0 lb

## 2021-09-30 DIAGNOSIS — R1319 Other dysphagia: Secondary | ICD-10-CM | POA: Diagnosis not present

## 2021-09-30 DIAGNOSIS — R1013 Epigastric pain: Secondary | ICD-10-CM | POA: Diagnosis not present

## 2021-09-30 NOTE — Progress Notes (Signed)
Christopher Repress, MD [address]  [address]  [place], [place] [address]  Main: [phone]  Fax: [phone]    Gastroenterology Consultation  Referring Provider:     Eustaquio Boyden, MD Primary Care Physician:  Christopher Boyden, MD Primary Gastroenterologist:  Dr. Arlyss Pittman Reason for Consultation:     Dysphagia        HPI:   Christopher Pittman is a 25 y.o. male referred by Dr. Eustaquio Boyden, MD  for consultation & management of dysphagia.  Patient had left tibial shaft fracture at work on 07/14/2021, underwent ORIF and rod placement.  Since then, patient reports that he has been experiencing tightness in his chest, difficulty swallowing solid food which is intermittent.  He underwent CT chest PE protocol because of chest tightness, PE was ruled out and there was no intrathoracic pathology.  Patient also reports early satiety, upper abdominal bloating and he feels like he is not having regular bowel movements.  He is undergoing physical therapy and currently on crutches.  He has not restarted work yet.  He denies any heartburn.  He does report pain in the retrosternal area.  He started on Pepcid 20 mg twice daily by his PCP with minimal relief.  His weight has been stable.  Patient is accompanied by his mom today.  Patient denies any anxiety or stress or depression  Patient does not smoke or drink alcohol  NSAIDs: None  Antiplts/Anticoagulants/Anti thrombotics: None  GI Procedures: None  Past Medical History:  Diagnosis Date   Open fracture of left tibia and fibula, type I or II, initial encounter 07/14/2021   Seasonal allergies    Vitamin D insufficiency 07/15/2021    Past Surgical History:  Procedure Laterality Date   ADENOIDECTOMY     I & D EXTREMITY Left 07/14/2021   Procedure: IRRIGATION AND DEBRIDEMENT EXTREMITY;  Surgeon: Myrene Galas, MD;  Location: [facility] OR;  Service: Orthopedics;  Laterality: Left;   MYRINGOTOMY     TIBIA IM NAIL INSERTION Left 07/14/2021    Procedure: INTRAMEDULLARY (IM) NAIL TIBIAL;  Surgeon: Myrene Galas, MD;  Location: [facility] OR;  Service: Orthopedics;  Laterality: Left;   TYMPANOPLASTY Left 04/08/2020   Procedure: LEFT TYMPANOPLASTY;  Surgeon: [name], MD;  Location: [facility];  Service: ENT;  Laterality: Left;   Current Outpatient Medications:    acetaminophen (TYLENOL) 500 MG tablet, Take 1 tablet (500 mg total) by mouth every 12 (twelve) hours., Disp: 60 tablet, Rfl: 0   cetirizine (ZYRTEC) 10 MG tablet, Take 10 mg by mouth daily., Disp: , Rfl:    famotidine (PEPCID) 20 MG tablet, Take 20 mg by mouth 2 (two) times daily., Disp: , Rfl:    montelukast (SINGULAIR) 10 MG tablet, Take 1 tablet (10 mg total) by mouth at bedtime., Disp: 30 tablet, Rfl: 11   multivitamin (ONE-A-DAY MEN'S) TABS tablet, Take 1 tablet by mouth daily., Disp: , Rfl:    simethicone (MYLICON) 125 MG chewable tablet, Chew 125 mg by mouth every 6 (six) hours as needed for flatulence., Disp: , Rfl:     No family history on file.   Social History   Tobacco Use   Smoking status: Never   Smokeless tobacco: Never  Vaping Use   Vaping Use: Never used  Substance Use Topics   Alcohol use: Never   Drug use: Never    Allergies as of 09/30/2021   (No Known Allergies)    Review of Systems:    All  systems reviewed and negative except where noted in HPI.   Physical Exam:  BP 124/86    Pulse 74    Temp 98.7 F (37.1 C) (Oral)    Ht 5\' 8"  (1.727 m)    Wt 148 lb (67.1 kg)    BMI 22.50 kg/m  No LMP for male patient.  General:   Alert,  Well-developed, well-nourished, pleasant and cooperative in NAD Head:  Normocephalic and atraumatic. Eyes:  Sclera clear, no icterus.   Conjunctiva pink. Ears:  Normal auditory acuity. Nose:  No deformity, discharge, or lesions. Mouth:  No deformity or lesions,oropharynx pink & moist. Neck:  Supple; no masses or thyromegaly. Lungs:  Respirations even and unlabored.  Clear throughout to  auscultation.   No wheezes, crackles, or rhonchi. No acute distress. Heart:  Regular rate and rhythm; no murmurs, clicks, rubs, or gallops. Abdomen:  Normal bowel sounds. Soft, mild epigastric tenderness and non-distended without masses, hepatosplenomegaly or hernias noted.  No guarding or rebound tenderness.   Rectal: Not performed Msk:  Symmetrical without gross deformities. Good, equal movement & strength bilaterally. Pulses:  Normal pulses noted. Extremities:  No clubbing or edema.  No cyanosis. Neurologic:  Alert and oriented x3;  grossly normal neurologically. Skin:  Intact without significant lesions or rashes. No jaundice. Psych:  Alert and cooperative. Normal mood and affect.  Imaging Studies: Reviewed  Assessment and Plan:   Christopher Pittman is a 25 y.o. pleasant African-American male with no significant past medical history is seen in consultation for 3 months history of intermittent dysphagia to solids and epigastric pain, early satiety and upper abdominal bloating.  Symptoms started since repair of left tibial shaft fracture, currently on Pepcid 20 mg twice daily  Recommend EGD to rule out any esophageal stricture, if normal, esophageal biopsies as well as gastric biopsies for symptoms of dyspepsia Continue Pepcid for now  I have discussed alternative options, risks & benefits,  which include, but are not limited to, bleeding, infection, perforation,respiratory complication & drug reaction.  The patient agrees with this plan & written consent will be obtained.     Follow up based on EGD findings   30, MD

## 2021-10-01 ENCOUNTER — Ambulatory Visit: Payer: BC Managed Care – PPO | Admitting: Anesthesiology

## 2021-10-01 ENCOUNTER — Encounter: Payer: Self-pay | Admitting: Gastroenterology

## 2021-10-01 ENCOUNTER — Encounter
Admission: RE | Disposition: A | Discharge status: Home / Self Care | Payer: Self-pay | Source: Home / Self Care | Attending: Gastroenterology

## 2021-10-01 ENCOUNTER — Ambulatory Visit
Admission: RE | Admit: 2021-10-01 | Discharge: 2021-10-01 | Disposition: A | Discharge status: Home / Self Care | Payer: BC Managed Care – PPO | Attending: Gastroenterology | Admitting: Gastroenterology

## 2021-10-01 ENCOUNTER — Other Ambulatory Visit: Payer: Self-pay

## 2021-10-01 DIAGNOSIS — R1310 Dysphagia, unspecified: Secondary | ICD-10-CM | POA: Insufficient documentation

## 2021-10-01 DIAGNOSIS — R1013 Epigastric pain: Secondary | ICD-10-CM | POA: Diagnosis not present

## 2021-10-01 DIAGNOSIS — R1319 Other dysphagia: Secondary | ICD-10-CM

## 2021-10-01 HISTORY — PX: ESOPHAGOGASTRODUODENOSCOPY (EGD) WITH PROPOFOL: SHX5813

## 2021-10-01 SURGERY — ESOPHAGOGASTRODUODENOSCOPY (EGD) WITH PROPOFOL
Anesthesia: General

## 2021-10-01 MED ORDER — LIDOCAINE HCL (CARDIAC) PF 100 MG/5ML IV SOSY
PREFILLED_SYRINGE | INTRAVENOUS | Status: DC | PRN
  Administered 2021-10-01: 50 mg via INTRAVENOUS

## 2021-10-01 MED ORDER — DEXMEDETOMIDINE (PRECEDEX) IN NS 20 MCG/5ML (4 MCG/ML) IV SYRINGE
PREFILLED_SYRINGE | INTRAVENOUS | Status: DC | PRN
  Administered 2021-10-01: 8 ug via INTRAVENOUS

## 2021-10-01 MED ORDER — SODIUM CHLORIDE 0.9 % IV SOLN: INTRAVENOUS | Status: DC

## 2021-10-01 MED ORDER — PROPOFOL 10 MG/ML IV BOLUS
INTRAVENOUS | Status: DC | PRN
  Administered 2021-10-01: 50 mg via INTRAVENOUS
  Administered 2021-10-01: 20 mg via INTRAVENOUS
  Administered 2021-10-01: 80 mg via INTRAVENOUS

## 2021-10-01 MED ORDER — LIDOCAINE HCL (PF) 2 % IJ SOLN
INTRAMUSCULAR | Status: AC
  Filled 2021-10-01: qty 5

## 2021-10-01 MED ORDER — PROPOFOL 500 MG/50ML IV EMUL
INTRAVENOUS | Status: DC | PRN
  Administered 2021-10-01: 175 ug/kg/min via INTRAVENOUS

## 2021-10-01 MED ORDER — PROPOFOL 500 MG/50ML IV EMUL
INTRAVENOUS | Status: AC
  Filled 2021-10-01: qty 50

## 2021-10-01 NOTE — Anesthesia Procedure Notes (Signed)
Date/Time: 10/01/2021 11:23 AM Performed by: Ginger Carne, CRNA Pre-anesthesia Checklist: Patient identified, Emergency Drugs available, Suction available, Patient being monitored and Timeout performed Patient Re-evaluated:Patient Re-evaluated prior to induction Oxygen Delivery Method: Nasal cannula Preoxygenation: Pre-oxygenation with 100% oxygen Induction Type: IV induction

## 2021-10-01 NOTE — Op Note (Signed)
[facility] Patient Name: Christopher Pittman Procedure Date: 10/01/2021 11:22 AM MRN: [id number] Account #: [id number] Date of Birth: [birth year] Admit Type: Outpatient Age: 25 Room: [facility] ENDO ROOM 4 Gender: Male Note Status: Finalized Instrument Name: Laurette Schimke [id number] Procedure:             Upper GI endoscopy Indications:           Dyspepsia, Dysphagia Providers:             Toney Reil MD, MD Referring MD:          Eustaquio Boyden (Referring MD) Medicines:             General Anesthesia Complications:         No immediate complications. Estimated blood loss: None. Procedure:             Pre-Anesthesia Assessment:                        - Prior to the procedure, a History and Physical was                         performed, and patient medications and allergies were                         reviewed. The patient is competent. The risks and                         benefits of the procedure and the sedation options and                         risks were discussed with the patient. All questions                         were answered and informed consent was obtained.                         Patient identification and proposed procedure were                         verified by the physician, the nurse, the                         anesthesiologist, the anesthetist and the technician                         in the pre-procedure area in the procedure room in the                         endoscopy suite. Mental Status Examination: alert and                         oriented. Airway Examination: normal oropharyngeal                         airway and neck mobility. Respiratory Examination:                         clear to auscultation. CV Examination: normal.  Prophylactic Antibiotics: The patient does not require                         prophylactic antibiotics. Prior Anticoagulants: The                         patient has  taken no previous anticoagulant or                         antiplatelet agents. ASA Grade Assessment: I - A                         normal, healthy patient. After reviewing the risks and                         benefits, the patient was deemed in satisfactory                         condition to undergo the procedure. The anesthesia                         plan was to use general anesthesia. Immediately prior                         to administration of medications, the patient was                         re-assessed for adequacy to receive sedatives. The                         heart rate, respiratory rate, oxygen saturations,                         blood pressure, adequacy of pulmonary ventilation, and                         response to care were monitored throughout the                         procedure. The physical status of the patient was                         re-assessed after the procedure.                        After obtaining informed consent, the endoscope was                         passed under direct vision. Throughout the procedure,                         the patient's blood pressure, pulse, and oxygen                         saturations were monitored continuously. The Endoscope                         was introduced through the mouth, and advanced to the  second part of duodenum. The upper GI endoscopy was                         accomplished without difficulty. The patient tolerated                         the procedure well. Findings:      The duodenal bulb and second portion of the duodenum were normal.      The entire examined stomach was normal. Biopsies were taken with a cold       forceps for Helicobacter pylori testing.      The cardia and gastric fundus were normal on retroflexion.      Esophagogastric landmarks were identified: the gastroesophageal junction       was found at 40 cm from the incisors.      The gastroesophageal  junction and examined esophagus were normal.       Biopsies were taken with a cold forceps for histology. Impression:            - Normal duodenal bulb and second portion of the                         duodenum.                        - Normal stomach. Biopsied.                        - Esophagogastric landmarks identified.                        - Normal gastroesophageal junction and esophagus.                         Biopsied. Recommendation:        - Await pathology results.                        - Discharge patient to home (with parent).                        - Resume regular diet today.                        - Continue present medications. Procedure Code(s):     --- Professional ---                        [phone], Esophagogastroduodenoscopy, flexible,                         transoral; with biopsy, single or multiple Diagnosis Code(s):     --- Professional ---                        R10.13, Epigastric pain                        R13.10, Dysphagia, unspecified CPT copyright 2019 American Medical Association. All rights reserved. The codes documented in this report are preliminary and upon coder review may  be revised to meet current compliance requirements. Dr. Libby Maw Toney Reil MD, MD 10/01/2021 11:37:22 AM This report  has been signed electronically. Number of Addenda: 0 Note Initiated On: 10/01/2021 11:22 AM Estimated Blood Loss:  Estimated blood loss: none.      [facility]

## 2021-10-01 NOTE — H&P (Signed)
Arlyss Repress, MD [address]  [address]  [place], [place] [address]  Main: [phone]  Fax: [phone] Pager: [phone]  Primary Care Physician:  Eustaquio Boyden, MD Primary Gastroenterologist:  Dr. Arlyss Repress  Pre-Procedure History & Physical: HPI:  Christopher Pittman is a 25 y.o. male is here for an endoscopy.   Past Medical History:  Diagnosis Date   Costochondritis 08/14/2021   Open fracture of left tibia and fibula, type I or II, initial encounter 07/14/2021   Open left tibial fracture 07/14/2021   Seasonal allergies    Tympanic membrane perforation, left 03/26/2020   Unintentional weight loss 08/14/2021   Vitamin D insufficiency 07/15/2021    Past Surgical History:  Procedure Laterality Date   ADENOIDECTOMY     I & D EXTREMITY Left 07/14/2021   Procedure: IRRIGATION AND DEBRIDEMENT EXTREMITY;  Surgeon: Myrene Galas, MD;  Location: [facility] OR;  Service: Orthopedics;  Laterality: Left;   MYRINGOTOMY     TIBIA IM NAIL INSERTION Left 07/14/2021   Procedure: INTRAMEDULLARY (IM) NAIL TIBIAL;  Surgeon: Myrene Galas, MD;  Location: [facility] OR;  Service: Orthopedics;  Laterality: Left;   TYMPANOPLASTY Left 04/08/2020   Procedure: LEFT TYMPANOPLASTY;  Surgeon: [name], MD;  Location: [facility];  Service: ENT;  Laterality: Left;    Prior to Admission medications   Medication Sig Start Date End Date Taking? Authorizing Provider  acetaminophen (TYLENOL) 500 MG tablet Take 1 tablet (500 mg total) by mouth every 12 (twelve) hours. 07/15/21  Yes Montez Morita, PA-C  cetirizine (ZYRTEC) 10 MG tablet Take 10 mg by mouth daily.   Yes [provider]  famotidine (PEPCID) 20 MG tablet Take 20 mg by mouth 2 (two) times daily.   Yes [provider]  montelukast (SINGULAIR) 10 MG tablet Take 1 tablet (10 mg total) by mouth at bedtime. 09/08/21  Yes Eustaquio Boyden, MD  multivitamin (ONE-A-DAY MEN'S) TABS tablet Take 1 tablet by mouth daily.   Yes  [provider]  simethicone (MYLICON) 125 MG chewable tablet Chew 125 mg by mouth every 6 (six) hours as needed for flatulence.   Yes [provider]    Allergies as of 09/30/2021   (No Known Allergies)    History reviewed. No pertinent family history.  Social History   Socioeconomic History   Marital status: Single    Spouse name: Not on file   Number of children: Not on file   Years of education: Not on file   Highest education level: Not on file  Occupational History   Not on file  Tobacco Use   Smoking status: Never   Smokeless tobacco: Never  Vaping Use   Vaping Use: Never used  Substance and Sexual Activity   Alcohol use: Never   Drug use: Never   Sexual activity: Not on file  Other Topics Concern   Not on file  Social History Narrative   Not on file   Social Determinants of Health   Financial Resource Strain: Not on file  Food Insecurity: Not on file  Transportation Needs: Not on file  Physical Activity: Not on file  Stress: Not on file  Social Connections: Not on file  Intimate Partner Violence: Not on file    Review of Systems: See HPI, otherwise negative ROS  Physical Exam: BP 119/84    Pulse 75    Temp (!) 96.9 F (36.1 C) (Temporal)    Resp 16    Ht 5\' 8"  (1.727 m)  Wt 150 lb (68 kg)    SpO2 100%    BMI 22.81 kg/m  General:   Alert,  pleasant and cooperative in NAD Head:  Normocephalic and atraumatic. Neck:  Supple; no masses or thyromegaly. Lungs:  Clear throughout to auscultation.    Heart:  Regular rate and rhythm. Abdomen:  Soft, nontender and nondistended. Normal bowel sounds, without guarding, and without rebound.   Neurologic:  Alert and  oriented x4;  grossly normal neurologically.  Impression/Plan: Christopher Pittman is here for an endoscopy to be performed for dysphagia and dyspepsia  Risks, benefits, limitations, and alternatives regarding  endoscopy have been reviewed with the patient.  Questions have been  answered.  All parties agreeable.   Lannette Donath, MD  10/01/2021, 10:41 AM

## 2021-10-01 NOTE — Anesthesia Preprocedure Evaluation (Signed)
Anesthesia Evaluation  Patient identified by MRN, date of birth, ID band Patient awake    Reviewed: Allergy & Precautions, NPO status , Patient's Chart, lab work & pertinent test results  Airway Mallampati: II  TM Distance: >3 FB Neck ROM: Full    Dental no notable dental hx.    Pulmonary neg pulmonary ROS,    Pulmonary exam normal breath sounds clear to auscultation       Cardiovascular negative cardio ROS Normal cardiovascular exam Rhythm:Regular Rate:Normal     Neuro/Psych negative neurological ROS  negative psych ROS   GI/Hepatic negative GI ROS, Neg liver ROS,   Endo/Other  negative endocrine ROS  Renal/GU negative Renal ROS     Musculoskeletal negative musculoskeletal ROS (+)   Abdominal   Peds  Hematology negative hematology ROS (+)   Anesthesia Other Findings Costochondritis 08/14/2021   Open fracture of left tibia and fibula, type I or II, initial encounter 07/14/2021   Open left tibial fracture 07/14/2021   Seasonal allergies    Tympanic membrane perforation, left 03/26/2020 Unintentional weight loss 08/14/2021  Vitamin D insufficiency 07/15/2021      Reproductive/Obstetrics                             Anesthesia Physical  Anesthesia Plan  ASA: 1  Anesthesia Plan: General   Post-op Pain Management:    Induction: Intravenous  PONV Risk Score and Plan: 2 and Propofol infusion and TIVA  Airway Management Planned: Natural Airway and Nasal Cannula  Additional Equipment:   Intra-op Plan:   Post-operative Plan: Extubation in OR  Informed Consent: I have reviewed the patients History and Physical, chart, labs and discussed the procedure including the risks, benefits and alternatives for the proposed anesthesia with the patient or authorized representative who has indicated his/her understanding and acceptance.     Dental advisory given  Plan Discussed with: CRNA,  Anesthesiologist and Surgeon  Anesthesia Plan Comments:        Anesthesia Quick Evaluation

## 2021-10-01 NOTE — Anesthesia Postprocedure Evaluation (Signed)
Anesthesia Post Note  Patient: Christopher Pittman  Procedure(s) Performed: ESOPHAGOGASTRODUODENOSCOPY (EGD) WITH PROPOFOL  Patient location during evaluation: Phase II Anesthesia Type: General Level of consciousness: awake and alert, awake and oriented Pain management: pain level controlled Vital Signs Assessment: post-procedure vital signs reviewed and stable Respiratory status: spontaneous breathing, nonlabored ventilation and respiratory function stable Cardiovascular status: blood pressure returned to baseline and stable Postop Assessment: no apparent nausea or vomiting Anesthetic complications: no   No notable events documented.   Last Vitals:  Vitals:   10/01/21 1201 10/01/21 1211  BP: 107/71 105/68  Pulse: 70 99  Resp: (!) 24 15  Temp:    SpO2: 100% 100%    Last Pain:  Vitals:   10/01/21 1211  TempSrc:   PainSc: 0-No pain                 Manfred Arch

## 2021-10-01 NOTE — Transfer of Care (Signed)
Immediate Anesthesia Transfer of Care Note  Patient: Christopher Pittman  Procedure(s) Performed: ESOPHAGOGASTRODUODENOSCOPY (EGD) WITH PROPOFOL  Patient Location: PACU  Anesthesia Type:General  Level of Consciousness: sedated  Airway & Oxygen Therapy: Patient Spontanous Breathing  Post-op Assessment: Report given to RN and Post -op Vital signs reviewed and stable  Post vital signs: Reviewed and stable  Last Vitals:  Vitals Value Taken Time  BP 94/57 10/01/21 1141  Temp    Pulse 64 10/01/21 1141  Resp 14 10/01/21 1141  SpO2 98 % 10/01/21 1141    Last Pain:  Vitals:   10/01/21 1036  TempSrc: Temporal  PainSc: 0-No pain         Complications: No notable events documented.

## 2021-10-02 ENCOUNTER — Encounter: Payer: Self-pay | Admitting: Gastroenterology

## 2021-10-02 ENCOUNTER — Telehealth: Payer: Self-pay

## 2021-10-02 ENCOUNTER — Other Ambulatory Visit: Payer: BC Managed Care – PPO

## 2021-10-02 LAB — SURGICAL PATHOLOGY

## 2021-10-02 NOTE — Telephone Encounter (Signed)
Called patient he understands results and to stop pepcid

## 2021-10-02 NOTE — Telephone Encounter (Signed)
-----   Message from Toney Reil, MD sent at 10/02/2021  4:24 PM EST ----- Please inform patient's mom that his pathology results from upper endoscopy and colonoscopy came back normal.  He can stop Pepcid  RV

## 2021-10-04 ENCOUNTER — Encounter: Payer: Self-pay | Admitting: Family Medicine

## 2021-10-07 ENCOUNTER — Other Ambulatory Visit: Payer: BC Managed Care – PPO

## 2021-10-09 MED ORDER — NAPROXEN 375 MG PO TBEC: 1.0000 | DELAYED_RELEASE_TABLET | Freq: Two times a day (BID) | ORAL | 0 refills | Status: DC

## 2021-10-09 NOTE — Addendum Note (Signed)
Addended by: Eustaquio Boyden on: 10/09/2021 01:19 PM   Modules accepted: Orders

## 2021-12-11 ENCOUNTER — Encounter: Payer: Self-pay | Admitting: Family Medicine

## 2021-12-16 ENCOUNTER — Other Ambulatory Visit: Payer: Self-pay

## 2021-12-16 ENCOUNTER — Ambulatory Visit (INDEPENDENT_AMBULATORY_CARE_PROVIDER_SITE_OTHER): Payer: BC Managed Care – PPO | Admitting: Nurse Practitioner

## 2021-12-16 VITALS — BP 108/76 | HR 66 | Temp 97.7°F | Resp 12 | Ht 68.0 in | Wt 153.4 lb

## 2021-12-16 DIAGNOSIS — R519 Headache, unspecified: Secondary | ICD-10-CM | POA: Insufficient documentation

## 2021-12-16 MED ORDER — KETOROLAC TROMETHAMINE 60 MG/2ML IM SOLN
60.0000 mg | Freq: Once | INTRAMUSCULAR | Status: AC
  Administered 2021-12-16: 60 mg via INTRAMUSCULAR

## 2021-12-16 NOTE — Assessment & Plan Note (Signed)
Patient here with a headache for over a week.  Neurological exam benign.  Mother does have history of migraines.  Patient no personal history.  Has tried Excedrin Migraine with some relief but cannot get it to abate.  Does have history of seasonal allergies that is currently maintained on antihistamine and antileukotriene.  Do not feel patient currently has a sinus infection.  We will give Toradol 60 mg IM x1 in office.  Patient stable for outpatient follow-up does not need imaging at current time.  Follow-up if no improvement.  Patient was instructed to avoid NSAIDs for 12 hours after Toradol injection. ?

## 2021-12-16 NOTE — Patient Instructions (Addendum)
Nice to see you today ?You need to avoid NSAIDs for the next 12 hours. This includes Excedrin, ibuprofen, naproxen, aleve, BC/Goody powders. ?If this does not improve let us know ? ?

## 2021-12-16 NOTE — Progress Notes (Signed)
Acute Office Visit  Subjective:    Patient ID: Christopher Pittman, male    DOB: 05-08-1996, 26 y.o.   MRN: [id number]  Chief Complaint  Patient presents with   Headache    X 1 week, wakes up in the morning with headaches. Throbbing pain in the temples. Has used Excedrin    HPI Patient is in today for Headache  States it started approx 1.5 weeks ago. Described as a dull ache behind his sinuses and bilateral temples. No history of migraine per patient Has been using Excedrin migraine OTC with some relief. It helps it get better but does not resolve it. States took an Excedrin this morning for the headache.  States that laying down does make the headache better. Bending forward can make it somewhat worse per patient report.  Of note he did recently had left leg surgery for a correction of fracture resulting from an accident at work. He has not history of blood clots. No family history of cancers or autoimmune diseases  Past Medical History:  Diagnosis Date   Costochondritis 08/14/2021   Open fracture of left tibia and fibula, type I or II, initial encounter 07/14/2021   Open left tibial fracture 07/14/2021   Seasonal allergies    Tympanic membrane perforation, left 03/26/2020   Unintentional weight loss 08/14/2021   Vitamin D insufficiency 07/15/2021    Past Surgical History:  Procedure Laterality Date   ADENOIDECTOMY     ESOPHAGOGASTRODUODENOSCOPY (EGD) WITH PROPOFOL N/A 10/01/2021   WNL, biopsies WNL (Vanga, Tally Due, MD)   I & D EXTREMITY Left 07/14/2021   Procedure: IRRIGATION AND DEBRIDEMENT EXTREMITY;  Surgeon: Altamese Springdale, MD;  Location: [place];  Service: Orthopedics;  Laterality: Left;   MYRINGOTOMY     TIBIA IM NAIL INSERTION Left 07/14/2021   Procedure: INTRAMEDULLARY (IM) NAIL TIBIAL;  Surgeon: Altamese Secretary, MD;  Location: [place];  Service: Orthopedics;  Laterality: Left;   TYMPANOPLASTY Left 04/08/2020   Procedure: LEFT TYMPANOPLASTY;  Surgeon: Izora Gala, MD;   Location: [place];  Service: ENT;  Laterality: Left;    No family history on file.  Social History   Socioeconomic History   Marital status: Single    Spouse name: Not on file   Number of children: Not on file   Years of education: Not on file   Highest education level: Not on file  Occupational History   Not on file  Tobacco Use   Smoking status: Never   Smokeless tobacco: Never  Vaping Use   Vaping Use: Never used  Substance and Sexual Activity   Alcohol use: Never   Drug use: Never   Sexual activity: Not on file  Other Topics Concern   Not on file  Social History Narrative   Not on file   Social Determinants of Health   Financial Resource Strain: Not on file  Food Insecurity: Not on file  Transportation Needs: Not on file  Physical Activity: Not on file  Stress: Not on file  Social Connections: Not on file  Intimate Partner Violence: Not on file    Outpatient Medications Prior to Visit  Medication Sig Dispense Refill   acetaminophen (TYLENOL) 500 MG tablet Take 1 tablet (500 mg total) by mouth every 12 (twelve) hours. 60 tablet 0   cetirizine (ZYRTEC) 10 MG tablet Take 10 mg by mouth daily.     famotidine (PEPCID) 20 MG tablet Take 20 mg by mouth 2 (two) times daily.  montelukast (SINGULAIR) 10 MG tablet Take 1 tablet (10 mg total) by mouth at bedtime. 30 tablet 11   multivitamin (ONE-A-DAY MEN'S) TABS tablet Take 1 tablet by mouth daily.     Naproxen 375 MG TBEC Take 1 tablet (375 mg total) by mouth in the morning and at bedtime. Take scheduled with meals for 1 week then as needed 60 tablet 0   No facility-administered medications prior to visit.    No Known Allergies  Review of Systems  Constitutional:  Negative for chills and fever.  HENT:  Positive for congestion.   Eyes:  Positive for photophobia. Negative for visual disturbance.  Respiratory:  Negative for cough and shortness of breath.   Cardiovascular:  Positive for chest pain  (happens after he eats and can take the pepcid with some relief).  Gastrointestinal:  Negative for abdominal pain, diarrhea, nausea and vomiting.  Neurological:  Positive for headaches. Negative for dizziness, seizures, weakness, light-headedness and numbness.  Psychiatric/Behavioral:  Negative for confusion.       Objective:    Physical Exam Vitals and nursing note reviewed.  Constitutional:      Appearance: He is well-developed.  HENT:     Right Ear: Tympanic membrane, ear canal and external ear normal.     Left Ear: Ear canal and external ear normal.     Nose:     Right Sinus: No maxillary sinus tenderness or frontal sinus tenderness.     Left Sinus: No maxillary sinus tenderness or frontal sinus tenderness.     Mouth/Throat:     Mouth: Mucous membranes are moist.     Pharynx: Oropharynx is clear.  Eyes:     Extraocular Movements: Extraocular movements intact.     Pupils: Pupils are equal, round, and reactive to light.  Cardiovascular:     Rate and Rhythm: Normal rate and regular rhythm.     Pulses: Normal pulses.     Heart sounds: Normal heart sounds.  Pulmonary:     Effort: Pulmonary effort is normal.     Breath sounds: Normal breath sounds.  Abdominal:     General: Bowel sounds are normal.  Musculoskeletal:     Cervical back: No signs of trauma, spasms, tenderness or bony tenderness.     Right lower leg: No edema.     Left lower leg: No edema.  Lymphadenopathy:     Cervical: No cervical adenopathy.  Skin:    General: Skin is warm.  Neurological:     General: No focal deficit present.     Mental Status: He is alert.     Deep Tendon Reflexes:     Reflex Scores:      Bicep reflexes are 2+ on the right side and 2+ on the left side.      Patellar reflexes are 2+ on the right side and 2+ on the left side.    Comments: Bilateral upper and lower extremity strength 5/5    BP 108/76    Pulse 66    Temp 97.7 F (36.5 C)    Resp 12    Ht 5\' 8"  (1.727 m)    Wt 153 lb 6 oz  (69.6 kg)    SpO2 98%    BMI 23.32 kg/m  Wt Readings from Last 3 Encounters:  12/16/21 153 lb 6 oz (69.6 kg)  10/01/21 150 lb (68 kg)  09/30/21 148 lb (67.1 kg)    Health Maintenance Due  Topic Date Due   COVID-19 Vaccine (1) Never done  HPV VACCINES (1 - Male 2-dose series) Never done   HIV Screening  Never done   Hepatitis C Screening  Never done       Topic Date Due   HPV VACCINES (1 - Male 2-dose series) Never done     No results found for: TSH Lab Results  Component Value Date   WBC 9.0 08/04/2021   HGB 14.4 08/04/2021   HCT 41.0 08/04/2021   MCV 90.5 08/04/2021   PLT 337 08/04/2021   Lab Results  Component Value Date   NA 135 08/04/2021   K 4.1 08/04/2021   CO2 27 08/04/2021   GLUCOSE 115 (H) 08/04/2021   BUN 15 08/04/2021   CREATININE 1.04 08/04/2021   BILITOT 1.7 (H) 07/23/2021   ALKPHOS 65 07/23/2021   AST 22 07/23/2021   ALT 21 07/23/2021   PROT 7.2 07/23/2021   ALBUMIN 3.8 07/23/2021   CALCIUM 9.6 08/04/2021   ANIONGAP 9 08/04/2021   No results found for: CHOL No results found for: HDL No results found for: LDLCALC No results found for: TRIG No results found for: CHOLHDL No results found for: HGBA1C     Assessment & Plan:   Problem List Items Addressed This Visit       Other   Acute intractable headache - Primary    Patient here with a headache for over a week.  Neurological exam benign.  Mother does have history of migraines.  Patient no personal history.  Has tried Excedrin Migraine with some relief but cannot get it to abate.  Does have history of seasonal allergies that is currently maintained on antihistamine and antileukotriene.  Do not feel patient currently has a sinus infection.  We will give Toradol 60 mg IM x1 in office.  Patient stable for outpatient follow-up does not need imaging at current time.  Follow-up if no improvement.  Patient was instructed to avoid NSAIDs for 12 hours after Toradol injection.        Meds ordered  this encounter  Medications   ketorolac (TORADOL) injection 60 mg   This visit occurred during the SARS-CoV-2 public health emergency.  Safety protocols were in place, including screening questions prior to the visit, additional usage of staff PPE, and extensive cleaning of exam room while observing appropriate contact time as indicated for disinfecting solutions.   Romilda Garret, NP

## 2021-12-17 ENCOUNTER — Telehealth: Payer: Self-pay | Admitting: Family Medicine

## 2021-12-17 ENCOUNTER — Other Ambulatory Visit: Payer: Self-pay | Admitting: Nurse Practitioner

## 2021-12-17 DIAGNOSIS — G44201 Tension-type headache, unspecified, intractable: Secondary | ICD-10-CM

## 2021-12-17 MED ORDER — FLUTICASONE PROPIONATE 50 MCG/ACT NA SUSP
2.0000 | Freq: Every day | NASAL | 0 refills | Status: DC
Start: 2021-12-17 — End: 2022-11-24

## 2021-12-17 MED ORDER — CYCLOBENZAPRINE HCL 5 MG PO TABS: 5.0000 mg | ORAL_TABLET | Freq: Every day | ORAL | 0 refills | Status: AC

## 2021-12-17 NOTE — Telephone Encounter (Signed)
Patient just wanted to give an update after receiving the shot for his headaches. He states he is still feeling small pain on a scale of 1-10, 4. ?

## 2021-12-17 NOTE — Telephone Encounter (Signed)
Sending note to Audria Nine NP who saw pt on 03/07/+23. ? ?[facility] Night - Client ?TELEPHONE ADVICE RECORD ?AccessNurse? ?Patient ?Name: ?Christopher Pittman ?Christopher Pittman ?Gender: Male ?DOB: 1996-02-10 ?Age: 26 Y 3 M 24 D ?Return ?Phone ?Number: ?[id number] ?(Primary), ?[id number] ?(Secondary) ?Address: ?City/ ?State/ ?Zip: ?[place] Pitt[place] ?[address] ?Client [facility] Night - Client ?Client Site [facility] - Night ?Provider Audria Nine- NP ?Contact Type Call ?Who Is Calling Patient / Member / Family / Caregiver ?Call Type Triage / Clinical ?Relationship To Patient Self ?Return Phone Number [phone] (Primary) ?Chief Complaint Headache ?Reason for Call Symptomatic / Request for Health Information ?Initial Comment Caller states he had an appt. He states he has a ?headache. ?Translation No ?Nurse Assessment ?Nurse: Lane Hacker, RN, Elvin So Date/Time Lamount Cohen Time): 12/17/2021 1:26:23 PM ?Confirm and document reason for call. If ?symptomatic, describe symptoms. ?---Caller states that he has a headache. Seen by MD ?yesterday. Given shot of Toradol, and advised to call ?back if s/s are the same. Med helped some, but HA ?persists. Rates pain intensity now at 4/10. Not tried any ?OTC meds yet today. Denies Christopher/V or fever. S/S for 1 & ?1/2 wks. ?Does the patient have any new or worsening ?symptoms? ---Yes ?Will a triage be completed? ---Yes ?Related visit to physician within the last 2 weeks? ---Yes ?Does the PT have any chronic conditions? (i.e. ?diabetes, asthma, this includes High risk factors for ?pregnancy, etc.) ?---No ?Is this a behavioral health or substance abuse call? ---No ?Guidelines ?Guideline Title Affirmed Question Affirmed Notes Nurse Date/Time (Eastern ?Time) ?Headache [1] MILDMODERATE ?headache AND [2] ?present > 72 hours ?Lane Hacker, RN, Elvin So 12/17/2021 1:29:15 PM ?Disp. Time (Eastern ?Time) Disposition Final User ?12/17/2021 1:22:44 PM Attempt made - message left Lane Hacker,  RN, Elvin So ?12/17/2021 1:31:52 PM SEE PCP WITHIN 3 DAYS Yes Lane Hacker, RN, Elvin So ?PLEASE NOTE: All timestamps contained within this report are represented as [place] Standard Time. ?CONFIDENTIALTY NOTICE: This fax transmission is intended only for the addressee. It contains information that is legally privileged, confidential or ?otherwise protected from use or disclosure. If you are not the intended recipient, you are strictly prohibited from reviewing, disclosing, copying using ?or disseminating any of this information or taking any action in reliance on or regarding this information. If you have received this fax in error, please ?notify us immediately by telephone so that we can arrange for its return to [place]. Phone: [phone], Toll-Free: [phone], Fax: [phone] ?Page: 2 of 2 ?Call Id: [id number] ?Caller Disagree/Comply Comply ?Caller Understands Yes ?PreDisposition Call Doctor ?Care Advice Given Per Guideline ?SEE PCP WITHIN 3 DAYS: * You need to be seen within 2 or 3 days. PAIN MEDICINES: * For pain relief, you can take either ?acetaminophen, ibuprofen, or naproxen. * Before taking any medicine, read all the instructions on the package. REST: * Lie down in ?a dark quiet place and relax until feeling better. CALL BACK IF: * Severe headache persists over 2 hours after pain medicine * Stiff ?neck occurs (i.e., can't touch chin to chest) * You become worse CARE ADVICE given per Headache (Adult) guideline. ?Comments ?User: Christopher Maid, RN Date/Time Lamount Cohen Time): 12/17/2021 1:20:46 PM ?Missing phone #. Will have rep to verify. ?User: Christopher Maid, RN Date/Time Lamount Cohen Time): 12/17/2021 1:21:55 PM ?From previous record: [phone] ?User: Christopher Maid, RN Date/Time Lamount Cohen Time): 12/17/2021 1:31:09 PM ?HA is about the same now as it was yesterday when he saw the MD. ?User: Christopher Maid, RN Date/Time Lamount Cohen  Time): 12/17/2021 1:32:55 PM ?Pt may just need this update sent to MD for possible f/u with labs etc ?

## 2021-12-17 NOTE — Telephone Encounter (Addendum)
I called and spoke with patient he had tried tylenol also without relief. We will trial a muscle relaxer and some Flonase since he describes it as pressure and a band like feeling.  ?

## 2021-12-19 ENCOUNTER — Telehealth: Payer: Self-pay

## 2021-12-19 NOTE — Telephone Encounter (Signed)
Patient told to call and follow up on headaches. States that he has had small improvement. Still located in middle of head above nose. Pain is 4/10 ache that is constant.  ?

## 2021-12-19 NOTE — Telephone Encounter (Signed)
Called and spoke to patient. He has been using the muscle relaxer and yesterday he had no headache but today the headache is back. He did meniton that he did take the muscle relaxer last night. He also had a new symptom of arm tingling and numbness. Told he I cannot evaluate that over the phone and that if he needs help with that or the headache over the weekend to see UC or ED. I will touch base with patient on Monday. He is aware of this. We also talked about getting a CT of head if need be ?

## 2021-12-20 ENCOUNTER — Encounter: Payer: Self-pay | Admitting: Nurse Practitioner

## 2021-12-21 ENCOUNTER — Telehealth: Payer: BC Managed Care – PPO | Admitting: Physician Assistant

## 2021-12-21 ENCOUNTER — Encounter: Payer: Self-pay | Admitting: Physician Assistant

## 2021-12-21 DIAGNOSIS — R519 Headache, unspecified: Secondary | ICD-10-CM | POA: Diagnosis not present

## 2021-12-21 NOTE — Progress Notes (Signed)
?Virtual Visit Consent  ? ?Christopher Pittman, you are scheduled for a virtual visit with a [facility] provider today.   ?  ?Just as with appointments in the office, your consent must be obtained to participate.  Your consent will be active for this visit and any virtual visit you may have with one of our providers in the next 365 days.   ?  ?If you have a MyChart account, a copy of this consent can be sent to you electronically.  All virtual visits are billed to your insurance company just like a traditional visit in the office.   ? ?As this is a virtual visit, video technology does not allow for your provider to perform a traditional examination.  This may limit your provider's ability to fully assess your condition.  If your provider identifies any concerns that need to be evaluated in person or the need to arrange testing (such as labs, EKG, etc.), we will make arrangements to do so.   ?  ?Although advances in technology are sophisticated, we cannot ensure that it will always work on either your end or our end.  If the connection with a video visit is poor, the visit may have to be switched to a telephone visit.  With either a video or telephone visit, we are not always able to ensure that we have a secure connection.    ? ?I need to obtain your verbal consent now.   Are you willing to proceed with your visit today?  ?  ?Christopher Pittman has provided verbal consent on 12/21/2021 for a virtual visit (video or telephone). ?  ?Jarold Motto, PA  ? ?Date: 12/21/2021 2:09 PM ? ? ?Virtual Visit via Video Note  ? ?IJarold Motto, connected with  Christopher Pittman  ([id number], [birth year]) on 12/21/21 at  1:30 PM EDT by a video-enabled telemedicine application and verified that I am speaking with the correct person using two identifiers. ? ?Location: ?Patient: Virtual Visit Location Patient: Home ?Provider: Virtual Visit Location Provider: Home Office ?  ?I discussed the limitations of evaluation and management by telemedicine  and the availability of in person appointments. The patient expressed understanding and agreed to proceed.   ? ?History of Present Illness: ?Christopher Pittman is a 26 y.o. who identifies as a male who was assigned male at birth, and is being seen today for headache. ? ?This is the second visit for this issue for this patient. He was seen by PCP office on 12/16/21 for HA. Was given toradol injection. At that time he had already had HA sx x 1.5 weeks. Symptoms have not improved. He also notes that he is having some having some L arm numbness and tingling -- this was discussed with provider on follow-up phone call on 12/19/21. He was recommended to go to UC or ER and he did not. ? ?He has taken tylenol and excedrin migraine without significant relief of symptoms. HA is current at his temples and is 5/10. He also endorses chest pain. States that it is consistent with his past episodes of GERD but he has not attempted to treat this with his OTC pepcid yet. Chest pain is not exertional. Denies SOB. ? ?He states that his mom looked in his nose and he his L nostril looks quite "blocked." Has been coughing up mucus and nasal discharge. PCP office did not feel like he had sinusitis. Denies recent caffeine intake, lack of hydration/nutrition. ? ?Denies: double vision/blurred vision, n/v. ? ?HPI: HPI  ?Problems:  ?  Patient Active Problem List  ? Diagnosis Date Noted  ? Acute intractable headache 12/16/2021  ? Dyspepsia   ? Dysphagia 09/17/2021  ? Abdominal discomfort 08/14/2021  ? Vitamin D insufficiency 07/15/2021  ? Seasonal allergic rhinitis 03/09/2018  ?  ?Allergies: No Known Allergies ?Medications:  ?Current Outpatient Medications:  ?  acetaminophen (TYLENOL) 500 MG tablet, Take 1 tablet (500 mg total) by mouth every 12 (twelve) hours., Disp: 60 tablet, Rfl: 0 ?  cetirizine (ZYRTEC) 10 MG tablet, Take 10 mg by mouth daily., Disp: , Rfl:  ?  cyclobenzaprine (FLEXERIL) 5 MG tablet, Take 1 tablet (5 mg total) by mouth at bedtime for  5 days., Disp: 5 tablet, Rfl: 0 ?  famotidine (PEPCID) 20 MG tablet, Take 20 mg by mouth 2 (two) times daily., Disp: , Rfl:  ?  fluticasone (FLONASE) 50 MCG/ACT nasal spray, Place 2 sprays into both nostrils daily., Disp: 16 g, Rfl: 0 ?  montelukast (SINGULAIR) 10 MG tablet, Take 1 tablet (10 mg total) by mouth at bedtime., Disp: 30 tablet, Rfl: 11 ? ?Observations/Objective: ?Patient is well-developed, well-nourished in no acute distress.  ?Resting comfortably  at home.  ?Head is normocephalic, atraumatic.  ?No labored breathing.  ?Speech is clear and coherent with logical content.  ?Patient is alert and oriented at baseline.  ? ? ?Assessment and Plan: ?1. Acute intractable headache, unspecified headache type ?Ongoing ?Given chronicity and patient's current sx of arm tingling/numbness, as well as chest pain, discussed he would best be served in ER setting ?He is agreeable; I provided him with information to [employer] [facility] [place] for further evaluation and management ? ?Follow Up Instructions: ?I discussed the assessment and treatment plan with the patient. The patient was provided an opportunity to ask questions and all were answered. The patient agreed with the plan and demonstrated an understanding of the instructions.  A copy of instructions were sent to the patient via MyChart unless otherwise noted below.  ? ?The patient was advised to call back or seek an in-person evaluation if the symptoms worsen or if the condition fails to improve as anticipated. ? ?Time:  ?I spent 10 minutes with the patient via telehealth technology discussing the above problems/concerns.   ? ?Jarold Motto, PA  ?

## 2021-12-22 ENCOUNTER — Encounter: Payer: Self-pay | Admitting: Family Medicine

## 2021-12-22 ENCOUNTER — Ambulatory Visit
Admission: EM | Admit: 2021-12-22 | Discharge: 2021-12-22 | Disposition: A | Discharge status: Home / Self Care | Payer: BC Managed Care – PPO | Attending: Internal Medicine | Admitting: Internal Medicine

## 2021-12-22 ENCOUNTER — Encounter: Payer: Self-pay | Admitting: Emergency Medicine

## 2021-12-22 ENCOUNTER — Other Ambulatory Visit: Payer: Self-pay

## 2021-12-22 ENCOUNTER — Telehealth: Payer: Self-pay | Admitting: Family Medicine

## 2021-12-22 DIAGNOSIS — J0101 Acute recurrent maxillary sinusitis: Secondary | ICD-10-CM | POA: Diagnosis not present

## 2021-12-22 DIAGNOSIS — R519 Headache, unspecified: Secondary | ICD-10-CM | POA: Diagnosis not present

## 2021-12-22 MED ORDER — AMOXICILLIN-POT CLAVULANATE 875-125 MG PO TABS: 1.0000 | ORAL_TABLET | Freq: Two times a day (BID) | ORAL | 0 refills | Status: DC

## 2021-12-22 MED ORDER — PREDNISONE 50 MG PO TABS: ORAL_TABLET | ORAL | 0 refills | Status: DC

## 2021-12-22 NOTE — Discharge Instructions (Signed)
Please follow up with your provider this week, because if you are not getting better you will need a head scan done.  ?

## 2021-12-22 NOTE — ED Triage Notes (Signed)
Pt here with 2 weeks of intermittent headaches starting at bridge of nose and moving to temples. Pt denies nausea, vision changes, numbness or tingling, and any neck pain. Pt endorses having a cold a few weeks ago and now is feeling pressure behind eyes and experiencing nasal congestion. Was seen at PCP last week and was given muscle relaxers for dx of tension headaches, but that only provides minimal relief.  ?

## 2021-12-22 NOTE — Telephone Encounter (Signed)
Pt called stating that he had an OV with Matt on 12/16/21. Pt stated that Susy Frizzle stated that he wanted him to have blood work done and an Personal assistant. Please advise. ?

## 2021-12-22 NOTE — Telephone Encounter (Signed)
Since he was evaluated by 2 other providers from different methods can we triage him and get the entire symptoms and updates prior to me making a decision please ?

## 2021-12-22 NOTE — Telephone Encounter (Signed)
Patient is calling in needing lab and x-ray he is still having headaches, ? ?Wants to have the lab and x-ray done today ? ?Please call the patient  [phone] ?

## 2021-12-22 NOTE — ED Provider Notes (Signed)
?[facility] ? ? ? ?CSN: [id number] ?Arrival date & time: 12/22/21  1643 ? ? ?  ? ?History   ?Chief Complaint ?Chief Complaint  ?Patient presents with  ? Headache  ? ? ?HPI ?Brinton Brandel is a 26 y.o. male who presents with hx of HA around his nose bridge x 2 weeks. He saw his PCP last week and was treated for tension HA and given Flexeril with minimal relief.  Does have nose congestion and post nasal drainage off and on of yellow color since 09/2021. Denies a fever. States he wakes up without a HA, then once he has been up for 30 minutes the HA starts. The HA is described as pressure, and starts on his L temple. Pressing on his temple or laying on it helps it feel better. Denies vision changes, N/V.   ?He tried to see his PCP today, but they did not have any appointments til Wed.this week.  ? ? ?Past Medical History:  ?Diagnosis Date  ? Costochondritis 08/14/2021  ? Open fracture of left tibia and fibula, type I or II, initial encounter 07/14/2021  ? Open left tibial fracture 07/14/2021  ? Seasonal allergies   ? Tympanic membrane perforation, left 03/26/2020  ? Unintentional weight loss 08/14/2021  ? Vitamin D insufficiency 07/15/2021  ? ? ?Patient Active Problem List  ? Diagnosis Date Noted  ? Acute intractable headache 12/16/2021  ? Dyspepsia   ? Dysphagia 09/17/2021  ? Abdominal discomfort 08/14/2021  ? Vitamin D insufficiency 07/15/2021  ? Seasonal allergic rhinitis 03/09/2018  ? ? ?Past Surgical History:  ?Procedure Laterality Date  ? ADENOIDECTOMY    ? ESOPHAGOGASTRODUODENOSCOPY (EGD) WITH PROPOFOL N/A 10/01/2021  ? WNL, biopsies WNL (Vanga, Loel Dubonnet, MD)  ? I & D EXTREMITY Left 07/14/2021  ? Procedure: IRRIGATION AND DEBRIDEMENT EXTREMITY;  Surgeon: Myrene Galas, MD;  Location: [facility] OR;  Service: Orthopedics;  Laterality: Left;  ? MYRINGOTOMY    ? TIBIA IM NAIL INSERTION Left 07/14/2021  ? Procedure: INTRAMEDULLARY (IM) NAIL TIBIAL;  Surgeon: Myrene Galas, MD;  Location: [facility] OR;  Service: Orthopedics;   Laterality: Left;  ? TYMPANOPLASTY Left 04/08/2020  ? Procedure: LEFT TYMPANOPLASTY;  Surgeon: [name], MD;  Location: [facility];  Service: ENT;  Laterality: Left;  ? ? ? ? ? ?Home Medications   ? ?Prior to Admission medications   ?Medication Sig Start Date End Date Taking? Authorizing Provider  ?acetaminophen (TYLENOL) 500 MG tablet Take 1 tablet (500 mg total) by mouth every 12 (twelve) hours. 07/15/21   Montez Morita, PA-C  ?cetirizine (ZYRTEC) 10 MG tablet Take 10 mg by mouth daily.    [provider]  ?cyclobenzaprine (FLEXERIL) 5 MG tablet Take 1 tablet (5 mg total) by mouth at bedtime for 5 days. 12/17/21 12/22/21  Eden Emms, NP  ?famotidine (PEPCID) 20 MG tablet Take 20 mg by mouth 2 (two) times daily.    [provider]  ?fluticasone (FLONASE) 50 MCG/ACT nasal spray Place 2 sprays into both nostrils daily. 12/17/21   Eden Emms, NP  ?montelukast (SINGULAIR) 10 MG tablet Take 1 tablet (10 mg total) by mouth at bedtime. 09/08/21   Eustaquio Boyden, MD  ? ? ?Family History ?History reviewed. No pertinent family history. ? ?Social History ?Social History  ? ?Tobacco Use  ? Smoking status: Never  ? Smokeless tobacco: Never  ?Vaping Use  ? Vaping Use: Never used  ?Substance Use Topics  ? Alcohol use: Never  ? Drug use:  Never  ? ? ? ?Allergies   ?Patient has no known allergies. ? ? ?Review of Systems ?Review of Systems  ?Constitutional:  Negative for chills, diaphoresis and fever.  ?HENT:  Positive for congestion, postnasal drip, sinus pressure and sinus pain. Negative for ear discharge, ear pain, rhinorrhea and sore throat.   ?Eyes:  Negative for discharge and visual disturbance.  ?Respiratory:  Negative for cough.   ?Gastrointestinal:  Negative for nausea and vomiting.  ?Musculoskeletal:  Negative for gait problem, neck pain and neck stiffness.  ?Skin:  Negative for rash.  ?Neurological:  Positive for headaches. Negative for dizziness and weakness.  ?Hematological:   Negative for adenopathy.  ? ? ?Physical Exam ?Triage Vital Signs ?ED Triage Vitals  ?Enc Vitals Group  ?   BP 12/22/21 1755 (!) 144/81  ?   Pulse Rate 12/22/21 1755 92  ?   Resp 12/22/21 1755 16  ?   Temp 12/22/21 1755 98.9 ?F (37.2 ?C)  ?   Temp Source 12/22/21 1755 Oral  ?   SpO2 12/22/21 1755 98 %  ?   Weight --   ?   Height --   ?   Head Circumference --   ?   Peak Flow --   ?   Pain Score 12/22/21 1801 5  ?   Pain Loc --   ?   Pain Edu? --   ?   Excl. in GC? --   ? ?No data found. ? ?Updated Vital Signs ?BP (!) 144/81 (BP Location: Left Arm)   Pulse 92   Temp 98.9 ?F (37.2 ?C) (Oral)   Resp 16   SpO2 98%  ? ?Visual Acuity ?Right Eye Distance:   ?Left Eye Distance:   ?Bilateral Distance:   ? ?Right Eye Near:   ?Left Eye Near:    ?Bilateral Near:    ? ? ? ?Physical Exam ?Vitals signs and nursing note reviewed.  ?Constitutional:   ?   General: He is not in acute distress. ?   Appearance: He is well-developed and normal weight. He is not ill-appearing, toxic-appearing or diaphoretic.  ?HENT:  ?   Head: Normocephalic, scalp or temples are not tender . Nose with mild congestion. Has tender maxillary sinuses, and they have decreased light transillumination. TM's are clear. Pharynx is clear ?HA was provoked when he bent over, and felt pressure on his face.  ?Eyes:  ?   Extraocular Movements: Extraocular movements intact.  ?   Pupils: Pupils are equal, round, and reactive to light.  ?Neck:  ?   Musculoskeletal: Neck supple. No neck rigidity.  ?   Meningeal: Brudzinski's sign absent.  ?Cardiovascular:  ?   Rate and Rhythm: Normal rate and regular rhythm.  ?   Heart sounds: No murmur.  ?Pulmonary:  ?   Effort: Pulmonary effort is normal.  ?   Breath sounds: Normal breath sounds. No wheezing, rhonchi or rales.  ?Abdominal:  ?   General: Bowel sounds are normal.  ?   Palpations: Abdomen is soft. There is no mass.  ?   Tenderness: There is no abdominal tenderness. There is no guarding.  ?Musculoskeletal: Normal range of  motion.  ?Lymphadenopathy:  ?   Cervical: No cervical adenopathy.  ?Skin: ?   General: Skin is warm and dry.  ?Neurological:  ?   Mental Status: He is alert.  ?   Cranial Nerves: No cranial nerve deficit or facial asymmetry.  ?   Sensory: No sensory deficit.  ?  Motor: No weakness.  ?   Coordination: Romberg sign negative. Coordination normal.  ?   Gait: Gait normal.  ?   Deep Tendon Reflexes: Reflexes normal.  ?   Comments: Normal Romberg, propioception, finger to nose, tandem gait.   ?Psychiatric:     ?   Mood and Affect: Mood normal.     ?   Speech: Speech normal.     ?   Behavior: Behavior normal.  ? ?UC Treatments / Results  ?Labs ?(all labs ordered are listed, but only abnormal results are displayed) ?Labs Reviewed - No data to display ? ?EKG ? ? ?Radiology ?No results found. ? ?Procedures ?Procedures (including critical care time) ? ?Medications Ordered in UC ?Medications - No data to display ? ?Initial Impression / Assessment and Plan / UC Course  ?I have reviewed the triage vital signs and the nursing notes. ?Unresolved HA which seems to resolve when supine. ?Has signs of Maxillary sinusitis and I placed him on Augmentin and Prednisone. I explained to him, his L temple HA does not seem related to the maxillary sinus tenderness, so I want him to FU with his PCP since they discussed possible head scan if the HA persisted. Pt said he could not be seen til Wed.  ? ? ? ? ?Final Clinical Impressions(s) / UC Diagnoses  ? ?Final diagnoses:  ?None  ? ?Discharge Instructions   ?None ?  ? ?ED Prescriptions   ?None ?  ? ?PDMP not reviewed this encounter. ?  ?Garey HamRodriguez-Southworth, [place]vin [employer], PA-C ?12/22/21 1906 ? ?

## 2021-12-22 NOTE — Telephone Encounter (Addendum)
I spoke with pt; pt continues with H/A at pain level of  4;. Flexeril helps pain until effectiveness wears off and then H/A returns. Pt has taken excedrin 2 at a time and has not helped h/a.pt said he does still have the numb tingling feeling in lt forearm. Pt said he told Matt that he had lifted wts 2 days prior to starting with numb tingly feeling in lt forearm and numb tingling sensation is no better but not any worse and pt has not lifted weights since the time mentioned earlier.  Pt having on and off slight dull mid chest pain  that last few mins each episode and pt said last episode was about 30 miins ago. Pt said does not have dizziness, no vision changes, no SOB and movement does not make anything hurt worse. Pt said no fever and no S/T. Pt is not at home but last took BP was normal. Pt said had same symptoms for 1 1/2 - 2 wks. Pt did say his mom had looked in nose and the lt nostril is completely blocked by swelling. Pt is going to [facility] for eval. Sending note to Audria Nine NP and Anastasiya CMA and FYI to Dr Reece Agar.  ?

## 2021-12-22 NOTE — Telephone Encounter (Signed)
Spoke with patient. Per chart Catalina Antigua talked to the patient on 12/19/21 and mentioned CT scan and labs maybe. Patient was advised to go to UC or ER for his symptoms but patient did e visit instead with another provider. He was advised by that provider to go to [place] location. Patient did not go anywhere for this. His headaches are the same not better but not worse. He takes Flexeril as needed and that helps the headaches but pain comes back once the medication wears off. He takes Excedrin as needed. Patient wanted to proceed with next steps for this. ?

## 2021-12-22 NOTE — Telephone Encounter (Signed)
Do not see that the patient has checked into urgent care as of yet. He was evlauted over the weekend by an e visit and instructed to go to the ED, but I see that he did not. I agree that he needs to be evaluated if he is having chest pain. He did mention the numbness/tingling to me on the phone Friday and I advised him I could not evaluate that over the phone. I instructed if it was concerning then he would need to go to ED or UC. Thank you for triaging patient ?

## 2021-12-22 NOTE — Telephone Encounter (Signed)
See phone note in regards to this. 

## 2021-12-23 NOTE — Telephone Encounter (Signed)
Patient has an appointment to follow up on 12/24/21 ?

## 2021-12-24 ENCOUNTER — Other Ambulatory Visit: Payer: Self-pay

## 2021-12-24 ENCOUNTER — Encounter: Payer: Self-pay | Admitting: Nurse Practitioner

## 2021-12-24 ENCOUNTER — Ambulatory Visit (INDEPENDENT_AMBULATORY_CARE_PROVIDER_SITE_OTHER): Payer: BC Managed Care – PPO | Admitting: Nurse Practitioner

## 2021-12-24 VITALS — BP 128/82 | HR 74 | Temp 98.6°F | Resp 12 | Ht 68.0 in | Wt 155.0 lb

## 2021-12-24 DIAGNOSIS — R1013 Epigastric pain: Secondary | ICD-10-CM | POA: Diagnosis not present

## 2021-12-24 DIAGNOSIS — R0789 Other chest pain: Secondary | ICD-10-CM

## 2021-12-24 DIAGNOSIS — R519 Headache, unspecified: Secondary | ICD-10-CM

## 2021-12-24 LAB — COMPREHENSIVE METABOLIC PANEL
ALT: 8 U/L (ref 0–53)
AST: 11 U/L (ref 0–37)
Albumin: 4.5 g/dL (ref 3.5–5.2)
Alkaline Phosphatase: 59 U/L (ref 39–117)
BUN: 18 mg/dL (ref 6–23)
CO2: 29 mEq/L (ref 19–32)
Calcium: 9.7 mg/dL (ref 8.4–10.5)
Chloride: 101 mEq/L (ref 96–112)
Creatinine, Ser: 1.09 mg/dL (ref 0.40–1.50)
GFR: 95.78 mL/min (ref >60.00)
Glucose, Bld: 103 mg/dL — ABNORMAL HIGH (ref 70–99)
Potassium: 4 mEq/L (ref 3.5–5.1)
Sodium: 137 mEq/L (ref 135–145)
Total Bilirubin: 2.2 mg/dL — ABNORMAL HIGH (ref 0.2–1.2)
Total Protein: 6.7 g/dL (ref 6.0–8.3)

## 2021-12-24 LAB — CBC
HCT: 41.4 % (ref 39.0–52.0)
Hemoglobin: 14 g/dL (ref 13.0–17.0)
MCHC: 33.8 g/dL (ref 30.0–36.0)
MCV: 90.5 fl (ref 78.0–100.0)
Platelets: 199 10*3/uL (ref 150.0–400.0)
RBC: 4.57 Mil/uL (ref 4.22–5.81)
RDW: 12.4 % (ref 11.5–15.5)
WBC: 10 10*3/uL (ref 4.0–10.5)

## 2021-12-24 LAB — TSH: TSH: 1.08 u[IU]/mL (ref 0.35–5.50)

## 2021-12-24 MED ORDER — OMEPRAZOLE 20 MG PO CPDR: 20.0000 mg | DELAYED_RELEASE_CAPSULE | Freq: Every day | ORAL | 0 refills | Status: DC

## 2021-12-24 MED ORDER — CYCLOBENZAPRINE HCL 5 MG PO TABS: 5.0000 mg | ORAL_TABLET | Freq: Every day | ORAL | 0 refills | Status: DC

## 2021-12-24 MED ORDER — SUMATRIPTAN SUCCINATE 50 MG PO TABS: ORAL_TABLET | ORAL | 0 refills | Status: DC

## 2021-12-24 NOTE — Progress Notes (Signed)
? ?Acute Office Visit ? ?Subjective:  ? ? Patient ID: Christopher Pittman, male    DOB: 02/24/1996, 26 y.o.   MRN: [id number] ? ?Chief Complaint  ?Patient presents with  ? Headaches follow up  ?  Is taking Augmentin and Prednisone. Headaches are still the same  ? ? ?HPI ?Patient is in today for headaches ? ?Accompanied by his mother at bedside. ? ?Was seen by me on 12/16/2021 for headaches, toradol injection was administered without relief. Patient called back and we trialed a muscle relaxer and flonase.  States that the headache would abate when he first got up and then returned. ?He was still having the headache and did a video visit on 12/21/2021 and was directed to ED, he was not seen ?Reached out to office on Monday 12/22/2021 and was directed to urgent care and treated for sinus infection.  He was placed on Augmentin and prednisone 50 mg daily for 3 days. ? ?Chest pain: states that he started to have chest pian 12/17/2021. States history of heartburn and endo without findings. States that when he was sitting still. Noticed it came right middle and left. Described as a sharp pain and dull pain. Intermittently. Some shortness. Pepcid he has tried and does not help as much. ? ?Headache: in betwnee eyes and nose and on left side of the head at temples. States it is an ahcing pain along with a throbbing pain. Present all the time. No light, sound, or smell sensitivty. No nausea or vomiting. No new numbness or tingling. Flexeril, flonase,excedrin and some tylenol.  Sunday was the last day patient use any over-the-counter medications for headache relief. ? ? ?Past Medical History:  ?Diagnosis Date  ? Costochondritis 08/14/2021  ? Open fracture of left tibia and fibula, type I or II, initial encounter 07/14/2021  ? Open left tibial fracture 07/14/2021  ? Seasonal allergies   ? Tympanic membrane perforation, left 03/26/2020  ? Unintentional weight loss 08/14/2021  ? Vitamin D insufficiency 07/15/2021  ? ? ?Past Surgical History:   ?Procedure Laterality Date  ? ADENOIDECTOMY    ? ESOPHAGOGASTRODUODENOSCOPY (EGD) WITH PROPOFOL N/A 10/01/2021  ? WNL, biopsies WNL (Vanga, Loel Dubonnet, MD)  ? I & D EXTREMITY Left 07/14/2021  ? Procedure: IRRIGATION AND DEBRIDEMENT EXTREMITY;  Surgeon: Myrene Galas, MD;  Location: [facility] OR;  Service: Orthopedics;  Laterality: Left;  ? MYRINGOTOMY    ? TIBIA IM NAIL INSERTION Left 07/14/2021  ? Procedure: INTRAMEDULLARY (IM) NAIL TIBIAL;  Surgeon: Myrene Galas, MD;  Location: [facility] OR;  Service: Orthopedics;  Laterality: Left;  ? TYMPANOPLASTY Left 04/08/2020  ? Procedure: LEFT TYMPANOPLASTY;  Surgeon: [name], MD;  Location: [facility];  Service: ENT;  Laterality: Left;  ? ? ?No family history on file. ? ?Social History  ? ?Socioeconomic History  ? Marital status: Single  ?  Spouse name: Not on file  ? Number of children: Not on file  ? Years of education: Not on file  ? Highest education level: Not on file  ?Occupational History  ? Not on file  ?Tobacco Use  ? Smoking status: Never  ? Smokeless tobacco: Never  ?Vaping Use  ? Vaping Use: Never used  ?Substance and Sexual Activity  ? Alcohol use: Never  ? Drug use: Never  ? Sexual activity: Not on file  ?Other Topics Concern  ? Not on file  ?Social History Narrative  ? Not on file  ? ?Social Determinants of Health  ? ?Financial Resource Strain: Not  on file  ?Food Insecurity: Not on file  ?Transportation Needs: Not on file  ?Physical Activity: Not on file  ?Stress: Not on file  ?Social Connections: Not on file  ?Intimate Partner Violence: Not on file  ? ? ?Outpatient Medications Prior to Visit  ?Medication Sig Dispense Refill  ? acetaminophen (TYLENOL) 500 MG tablet Take 1 tablet (500 mg total) by mouth every 12 (twelve) hours. 60 tablet 0  ? amoxicillin-clavulanate (AUGMENTIN) 875-125 MG tablet Take 1 tablet by mouth every 12 (twelve) hours. 14 tablet 0  ? cetirizine (ZYRTEC) 10 MG tablet Take 10 mg by mouth daily.    ? famotidine (PEPCID) 20 MG  tablet Take 20 mg by mouth 2 (two) times daily.    ? fluticasone (FLONASE) 50 MCG/ACT nasal spray Place 2 sprays into both nostrils daily. 16 g 0  ? montelukast (SINGULAIR) 10 MG tablet Take 1 tablet (10 mg total) by mouth at bedtime. 30 tablet 11  ? predniSONE (DELTASONE) 50 MG tablet One qd 3 tablet 0  ? ?No facility-administered medications prior to visit.  ? ? ?No Known Allergies ? ?Review of Systems  ?Constitutional:  Negative for chills and fever.  ?Eyes:  Negative for photophobia and visual disturbance.  ?Respiratory:  Positive for shortness of breath. Negative for cough.   ?Cardiovascular:  Positive for chest pain. Negative for leg swelling.  ?Neurological:  Positive for headaches. Negative for weakness and numbness.  ? ?   ?Objective:  ?  ?Physical Exam ?Vitals and nursing note reviewed.  ?Constitutional:   ?   Appearance: Normal appearance.  ?HENT:  ?   Right Ear: Tympanic membrane, ear canal and external ear normal.  ?   Left Ear: Tympanic membrane, ear canal and external ear normal.  ?   Mouth/Throat:  ?   Mouth: Mucous membranes are moist.  ?   Pharynx: Oropharynx is clear.  ?Eyes:  ?   Extraocular Movements: Extraocular movements intact.  ?   Pupils: Pupils are equal, round, and reactive to light.  ?Cardiovascular:  ?   Rate and Rhythm: Normal rate and regular rhythm.  ?   Heart sounds: Normal heart sounds.  ?Pulmonary:  ?   Effort: Pulmonary effort is normal.  ?   Breath sounds: Normal breath sounds.  ?Abdominal:  ?   General: Bowel sounds are normal. There is no distension.  ?   Palpations: There is no mass.  ?   Tenderness: There is abdominal tenderness in the epigastric area.  ?   Hernia: No hernia is present.  ? ? ?Musculoskeletal:  ?   Right lower leg: No edema.  ?   Left lower leg: No edema.  ?Lymphadenopathy:  ?   Cervical: No cervical adenopathy.  ?Skin: ?   General: Skin is warm.  ?Neurological:  ?   General: No focal deficit present.  ?   Mental Status: He is alert.  ?   Deep Tendon  Reflexes:  ?   Reflex Scores: ?     Bicep reflexes are 2+ on the right side and 2+ on the left side. ?     Patellar reflexes are 2+ on the right side and 2+ on the left side. ?   Comments: Bilateral upper and lower extremity strength 5/5  ?Psychiatric:     ?   Mood and Affect: Mood normal.     ?   Behavior: Behavior normal.     ?   Thought Content: Thought content normal.     ?  Judgment: Judgment normal.  ? ? ?BP 128/82   Pulse 74   Temp 98.6 ?F (37 ?C)   Resp 12   Ht 5\' 8"  (1.727 m)   Wt 155 lb (70.3 kg)   SpO2 100%   BMI 23.57 kg/m?  ?Wt Readings from Last 3 Encounters:  ?12/24/21 155 lb (70.3 kg)  ?12/16/21 153 lb 6 oz (69.6 kg)  ?10/01/21 150 lb (68 kg)  ? ? ?Health Maintenance Due  ?Topic Date Due  ? COVID-19 Vaccine (1) Never done  ? HPV VACCINES (1 - Male 2-dose series) Never done  ? HIV Screening  Never done  ? Hepatitis C Screening  Never done  ? ? ?   ?Topic Date Due  ? HPV VACCINES (1 - Male 2-dose series) Never done  ? ? ? ?Lab Results  ?Component Value Date  ? TSH 1.08 12/24/2021  ? ?Lab Results  ?Component Value Date  ? WBC 10.0 12/24/2021  ? HGB 14.0 12/24/2021  ? HCT 41.4 12/24/2021  ? MCV 90.5 12/24/2021  ? PLT 199.0 12/24/2021  ? ?Lab Results  ?Component Value Date  ? NA 137 12/24/2021  ? K 4.0 12/24/2021  ? CO2 29 12/24/2021  ? GLUCOSE 103 (H) 12/24/2021  ? BUN 18 12/24/2021  ? CREATININE 1.09 12/24/2021  ? BILITOT 2.2 (H) 12/24/2021  ? ALKPHOS 59 12/24/2021  ? AST 11 12/24/2021  ? ALT 8 12/24/2021  ? PROT 6.7 12/24/2021  ? ALBUMIN 4.5 12/24/2021  ? CALCIUM 9.7 12/24/2021  ? ANIONGAP 9 08/04/2021  ? GFR 95.78 12/24/2021  ? ?No results found for: CHOL ?No results found for: HDL ?No results found for: LDLCALC ?No results found for: TRIG ?No results found for: CHOLHDL ?No results found for: HGBA1C ? ?   ?Assessment & Plan:  ? ?Problem List Items Addressed This Visit   ? ?  ? Other  ? Epigastric pain  ?  Patient has dyspepsia in the past.  Has been trialed on Pepcid or famotidine without  great relief.  Having some atypical chest pain.  On exam he did have epigastric tenderness to palpation.  Given the patient's been using Excedrin and over-the-counter analgesics likely flared up his epigastrium.  We will

## 2021-12-24 NOTE — Assessment & Plan Note (Signed)
Patient has dyspepsia in the past.  Has been trialed on Pepcid or famotidine without great relief.  Having some atypical chest pain.  On exam he did have epigastric tenderness to palpation.  Given the patient's been using Excedrin and over-the-counter analgesics likely flared up his epigastrium.  We will trial patient on omeprazole 20 mg daily for 1 month.  Continue to monitor if no improvement ?

## 2021-12-24 NOTE — Patient Instructions (Signed)
Nice to see you today ?The EKG looked great in office ?I will be in touch with the labs once I have them. ?Follow up if no improvement or symptoms worsen acutely  ?

## 2021-12-24 NOTE — Assessment & Plan Note (Signed)
Atypical chest pain ambiguous in nature no correlating factor.  Patient does have a history of dyspepsia.  No history of DVT/PE.  EKG within normal limits in office.  Exam is benign.  We will send off D-dimer as patient had a large bone fracture within 5 months ago.  Low suspicion for PE ?

## 2021-12-24 NOTE — Assessment & Plan Note (Signed)
Patient still experiencing headache.  Has been refractory to IM Toradol, muscle relaxers, Flonase, prednisone, and over-the-counter analgesics.  Patient's mother has history of migraines.  Patient is an within age range of when migraine syndrome with present.  Given he has tried many treatments as listed previously we will start him on Imitrex 50 mg to see if we can break the cycle.  Did have in-depth discussion with patient and mother regarding imaging.  Patient does not need imaging at current time as neurological exam is benign.  Did discuss CT radiation exposure given patient's age and lifetime exposure of radiation.  Pending lab work, follow-up if symptoms not improve ?

## 2021-12-25 ENCOUNTER — Other Ambulatory Visit (INDEPENDENT_AMBULATORY_CARE_PROVIDER_SITE_OTHER): Payer: BC Managed Care – PPO

## 2021-12-25 ENCOUNTER — Encounter: Payer: Self-pay | Admitting: Nurse Practitioner

## 2021-12-25 DIAGNOSIS — R519 Headache, unspecified: Secondary | ICD-10-CM | POA: Diagnosis not present

## 2021-12-25 DIAGNOSIS — R0789 Other chest pain: Secondary | ICD-10-CM

## 2021-12-25 LAB — D-DIMER, QUANTITATIVE: D-Dimer, Quant: 0.27 mcg/mL FEU (ref <0.50)

## 2021-12-25 LAB — SEDIMENTATION RATE: Sed Rate: 4 mm/hr (ref 0–15)

## 2022-01-05 ENCOUNTER — Ambulatory Visit: Payer: BC Managed Care – PPO | Admitting: Nurse Practitioner

## 2022-01-05 ENCOUNTER — Other Ambulatory Visit: Payer: Self-pay

## 2022-01-05 VITALS — BP 120/82 | HR 91 | Temp 97.7°F | Resp 12 | Ht 68.0 in | Wt 155.4 lb

## 2022-01-05 DIAGNOSIS — R519 Headache, unspecified: Secondary | ICD-10-CM

## 2022-01-05 MED ORDER — TOPIRAMATE 25 MG PO TABS: 25.0000 mg | ORAL_TABLET | Freq: Every day | ORAL | 1 refills | Status: DC

## 2022-01-05 MED ORDER — SUMATRIPTAN SUCCINATE 50 MG PO TABS: ORAL_TABLET | ORAL | 1 refills | Status: DC

## 2022-01-05 NOTE — Progress Notes (Signed)
? ?Acute Office Visit ? ?Subjective:  ? ? Patient ID: Christopher Pittman, male    DOB: 1996-03-24, 24 y.o.   MRN: [id number] ? ?Chief Complaint  ?Patient presents with  ? Headache  ?  Off and on, left side of the head has a nagging pain. Intensity of the pain is the same as it has been. He has noticed that the pain will go away with activity but notices it with resting.  ? ? ? ?Patient is in today for headache ? ?Patient was seen on 12/16/21 and then on 12/24/2021 by me. [facility] and was dx with sinusitis ? ?Last office visit we did a panel of blood and started patient on Imitrex for abortive headache therapy. This helped with the one headache.  ? ?All of this started towards the beginning of February 2023 ? ?States that he has a nagging pain to the left side of his head and then will go away on its own with out intervention. States that he has tried Excedrin without relief states that he noticed that if he took the Imitrex that it helped some of the time. ?States that it is a dull achy pain that is daily and will remitt. States that it does not feel like a headache. States it is present and currently at a level 4/10. ?Mother is at beside with patient today ? ?Past Medical History:  ?Diagnosis Date  ? Costochondritis 08/14/2021  ? Open fracture of left tibia and fibula, type I or II, initial encounter 07/14/2021  ? Open left tibial fracture 07/14/2021  ? Seasonal allergies   ? Tympanic membrane perforation, left 03/26/2020  ? Unintentional weight loss 08/14/2021  ? Vitamin D insufficiency 07/15/2021  ? ? ?Past Surgical History:  ?Procedure Laterality Date  ? ADENOIDECTOMY    ? ESOPHAGOGASTRODUODENOSCOPY (EGD) WITH PROPOFOL N/A 10/01/2021  ? WNL, biopsies WNL (Vanga, Loel Dubonnetohini Reddy, MD)  ? I & D EXTREMITY Left 07/14/2021  ? Procedure: IRRIGATION AND DEBRIDEMENT EXTREMITY;  Surgeon: Myrene GalasHandy, Michael, MD;  [facility];  Service: Orthopedics;  Laterality: Left;  ? MYRINGOTOMY    ? TIBIA IM NAIL  INSERTION Left 07/14/2021  ? Procedure: INTRAMEDULLARY (IM) NAIL TIBIAL;  Surgeon: Myrene GalasHandy, Michael, MD;  Location: [facility] OR;  Service: Orthopedics;  Laterality: Left;  ? TYMPANOPLASTY Left 04/08/2020  ? Procedure: LEFT TYMPANOPLASTY;  Surgeon: Serena Colonelosen, Jefry, MD;  Location: [facility];  Service: ENT;  Laterality: Left;  ? ? ?No family history on file. ? ?Social History  ? ?Socioeconomic History  ? Marital status: Single  ?  Spouse name: Not on file  ? Number of children: Not on file  ? Years of education: Not on file  ? Highest education level: Not on file  ?Occupational History  ? Not on file  ?Tobacco Use  ? Smoking status: Never  ? Smokeless tobacco: Never  ?Vaping Use  ? Vaping Use: Never used  ?Substance and Sexual Activity  ? Alcohol use: Never  ? Drug use: Never  ? Sexual activity: Not on file  ?Other Topics Concern  ? Not on file  ?Social History Narrative  ? Not on file  ? ?Social Determinants of Health  ? ?Financial Resource Strain: Not on file  ?Food Insecurity: Not on file  ?Transportation Needs: Not on file  ?Physical Activity: Not on file  ?Stress: Not on file  ?Social Connections: Not on file  ?Intimate Partner Violence: Not on file  ? ? ?Outpatient Medications Prior  to Visit  ?Medication Sig Dispense Refill  ? acetaminophen (TYLENOL) 500 MG tablet Take 1 tablet (500 mg total) by mouth every 12 (twelve) hours. 60 tablet 0  ? cetirizine (ZYRTEC) 10 MG tablet Take 10 mg by mouth daily.    ? cyclobenzaprine (FLEXERIL) 5 MG tablet Take 1 tablet (5 mg total) by mouth at bedtime. 10 tablet 0  ? famotidine (PEPCID) 20 MG tablet Take 20 mg by mouth 2 (two) times daily. As needed    ? fluticasone (FLONASE) 50 MCG/ACT nasal spray Place 2 sprays into both nostrils daily. 16 g 0  ? montelukast (SINGULAIR) 10 MG tablet Take 1 tablet (10 mg total) by mouth at bedtime. 30 tablet 11  ? omeprazole (PRILOSEC) 20 MG capsule Take 1 capsule (20 mg total) by mouth daily. 30 capsule 0  ? SUMAtriptan (IMITREX) 50 MG  tablet Take 1 tablet at onset of headache. May repeat in 2 hours if headache persists or recurs. (Patient not taking: Reported on 01/05/2022) 10 tablet 0  ? amoxicillin-clavulanate (AUGMENTIN) 875-125 MG tablet Take 1 tablet by mouth every 12 (twelve) hours. 14 tablet 0  ? predniSONE (DELTASONE) 50 MG tablet One qd 3 tablet 0  ? ?No facility-administered medications prior to visit.  ? ? ?No Known Allergies ? ?Review of Systems  ?Constitutional:  Negative for chills and fever.  ?Eyes:  Negative for visual disturbance.  ?Gastrointestinal:  Negative for diarrhea, nausea and vomiting.  ?Neurological:  Positive for headaches. Negative for weakness and numbness.  ? ?   ?Objective:  ?  ?Physical Exam ?Vitals and nursing note reviewed.  ?Constitutional:   ?   Appearance: Normal appearance.  ?HENT:  ?   Right Ear: Tympanic membrane, ear canal and external ear normal. There is no impacted cerumen.  ?   Left Ear: Tympanic membrane, ear canal and external ear normal. There is no impacted cerumen.  ?   Mouth/Throat:  ?   Mouth: Mucous membranes are moist.  ?   Pharynx: Oropharynx is clear.  ?Eyes:  ?   Extraocular Movements: Extraocular movements intact.  ?   Pupils: Pupils are equal, round, and reactive to light.  ?   Comments: Attempted to fundic exam in office.  Unable to do a full/successful exam.  Was able to visualize some vessels and backs of bilateral eyes.  Did discuss the limitations of my exam with patient and patient's mother  ?Cardiovascular:  ?   Rate and Rhythm: Normal rate and regular rhythm.  ?   Heart sounds: Normal heart sounds.  ?Pulmonary:  ?   Effort: Pulmonary effort is normal.  ?   Breath sounds: Normal breath sounds.  ?Musculoskeletal:  ?   Right lower leg: No edema.  ?   Left lower leg: No edema.  ?Lymphadenopathy:  ?   Cervical: No cervical adenopathy.  ?Skin: ?   General: Skin is warm.  ?Neurological:  ?   General: No focal deficit present.  ?   Mental Status: He is alert.  ?   Gait: Gait normal.  ?    Deep Tendon Reflexes:  ?   Reflex Scores: ?     Bicep reflexes are 2+ on the right side and 2+ on the left side. ?     Patellar reflexes are 2+ on the right side and 2+ on the left side. ?   Comments: Bilateral upper and lower extremity strength 5/5  ? ? ?BP 120/82   Pulse 91   Temp 97.7 ?F (  36.5 ?C)   Resp 12   Ht 5\' 8"  (1.727 m)   Wt 155 lb 7 oz (70.5 kg)   SpO2 98%   BMI 23.63 kg/m?  ?Wt Readings from Last 3 Encounters:  ?01/05/22 155 lb 7 oz (70.5 kg)  ?12/24/21 155 lb (70.3 kg)  ?12/16/21 153 lb 6 oz (69.6 kg)  ? ? ?Health Maintenance Due  ?Topic Date Due  ? COVID-19 Vaccine (1) Never done  ? HPV VACCINES (1 - Male 2-dose series) Never done  ? HIV Screening  Never done  ? Hepatitis C Screening  Never done  ? ? ?   ?Topic Date Due  ? HPV VACCINES (1 - Male 2-dose series) Never done  ? ? ? ?Lab Results  ?Component Value Date  ? TSH 1.08 12/24/2021  ? ?Lab Results  ?Component Value Date  ? WBC 10.0 12/24/2021  ? HGB 14.0 12/24/2021  ? HCT 41.4 12/24/2021  ? MCV 90.5 12/24/2021  ? PLT 199.0 12/24/2021  ? ?Lab Results  ?Component Value Date  ? NA 137 12/24/2021  ? K 4.0 12/24/2021  ? CO2 29 12/24/2021  ? GLUCOSE 103 (H) 12/24/2021  ? BUN 18 12/24/2021  ? CREATININE 1.09 12/24/2021  ? BILITOT 2.2 (H) 12/24/2021  ? ALKPHOS 59 12/24/2021  ? AST 11 12/24/2021  ? ALT 8 12/24/2021  ? PROT 6.7 12/24/2021  ? ALBUMIN 4.5 12/24/2021  ? CALCIUM 9.7 12/24/2021  ? ANIONGAP 9 08/04/2021  ? GFR 95.78 12/24/2021  ? ?No results found for: CHOL ?No results found for: HDL ?No results found for: LDLCALC ?No results found for: TRIG ?No results found for: CHOLHDL ?No results found for: HGBA1C ? ?   ?Assessment & Plan:  ? ?Problem List Items Addressed This Visit   ? ?  ? Other  ? Acute intractable headache - Primary  ?  Patient has been evaluated several times.  Tried NSAIDs, muscle relaxers, and most recently triptan therapy.  Triptan therapy was not successful patient still having left sided temporal nagging pain that is  intermittent and daily.  Did have discussion with patient and patient's mother recommend eye exam to make sure no fundic edema as exam was limited in office.  Patient not having any visual disturbances.  Mother does have

## 2022-01-05 NOTE — Patient Instructions (Signed)
Nice to see you today ?We will try the preventative medications ?I agree with mom about getting an eye exam.  ?Let me know how you are doing in about 10 days ?

## 2022-01-05 NOTE — Assessment & Plan Note (Signed)
Patient has been evaluated several times.  Tried NSAIDs, muscle relaxers, and most recently triptan therapy.  Triptan therapy was not successful patient still having left sided temporal nagging pain that is intermittent and daily.  Did have discussion with patient and patient's mother recommend eye exam to make sure no fundic edema as exam was limited in office.  Patient not having any visual disturbances.  Mother does have history of migraines.  With triptan being of some benefit we will start patient on topiramate 25 mg nightly to see if we can prevent the headache.  If this is of not benefit consider CT scan of head as this will be persistent and failing normal treatment.  Differentials can include pseudotumor cerebri, presentation of migraines, mass in the brain.  Did have detailed discussion last time with patient and patient's mom about CT scans and radiation exposure given patient's young age.  Did review signs and symptoms as when to seek help and update clinic. ?

## 2022-01-08 ENCOUNTER — Other Ambulatory Visit: Payer: Self-pay | Admitting: Nurse Practitioner

## 2022-01-08 DIAGNOSIS — G44201 Tension-type headache, unspecified, intractable: Secondary | ICD-10-CM

## 2022-01-14 ENCOUNTER — Other Ambulatory Visit: Payer: Self-pay | Admitting: Nurse Practitioner

## 2022-01-14 DIAGNOSIS — R519 Headache, unspecified: Secondary | ICD-10-CM

## 2022-01-15 ENCOUNTER — Other Ambulatory Visit: Payer: Self-pay | Admitting: Nurse Practitioner

## 2022-01-15 DIAGNOSIS — R1013 Epigastric pain: Secondary | ICD-10-CM

## 2022-01-23 NOTE — Telephone Encounter (Signed)
Christopher Pittman, can you take a look at this today if possible. Thank you ?

## 2022-01-27 NOTE — Telephone Encounter (Signed)
Error

## 2022-01-28 ENCOUNTER — Other Ambulatory Visit: Payer: Self-pay | Admitting: Nurse Practitioner

## 2022-01-28 ENCOUNTER — Ambulatory Visit
Admission: RE | Admit: 2022-01-28 | Discharge: 2022-01-28 | Disposition: A | Discharge status: Home / Self Care | Payer: BC Managed Care – PPO | Source: Ambulatory Visit | Attending: Nurse Practitioner | Admitting: Nurse Practitioner

## 2022-01-28 ENCOUNTER — Encounter: Payer: Self-pay | Admitting: Nurse Practitioner

## 2022-01-28 DIAGNOSIS — R519 Headache, unspecified: Secondary | ICD-10-CM

## 2022-01-29 ENCOUNTER — Ambulatory Visit: Payer: BC Managed Care – PPO

## 2022-01-29 NOTE — Telephone Encounter (Signed)
Spoke with patient and scheduled for 02/03/22 ?

## 2022-01-29 NOTE — Telephone Encounter (Signed)
Can we get him scheduled up with Dr. Reece Agar please ?

## 2022-01-30 ENCOUNTER — Ambulatory Visit: Payer: BC Managed Care – PPO

## 2022-02-03 ENCOUNTER — Encounter: Payer: Self-pay | Admitting: Family Medicine

## 2022-02-03 ENCOUNTER — Ambulatory Visit: Payer: BC Managed Care – PPO

## 2022-02-03 ENCOUNTER — Ambulatory Visit (INDEPENDENT_AMBULATORY_CARE_PROVIDER_SITE_OTHER): Payer: BC Managed Care – PPO | Admitting: Family Medicine

## 2022-02-03 VITALS — BP 120/82 | HR 73 | Temp 97.9°F | Ht 68.0 in | Wt 153.0 lb

## 2022-02-03 DIAGNOSIS — R519 Headache, unspecified: Secondary | ICD-10-CM | POA: Diagnosis not present

## 2022-02-03 DIAGNOSIS — H7392 Unspecified disorder of tympanic membrane, left ear: Secondary | ICD-10-CM | POA: Diagnosis not present

## 2022-02-03 DIAGNOSIS — Z13220 Encounter for screening for lipoid disorders: Secondary | ICD-10-CM

## 2022-02-03 NOTE — Progress Notes (Signed)
? ? Patient ID: Christopher Pittman, male    DOB: 1996-02-09, 26 y.o.   MRN: [id number] ? ?This visit was conducted in person. ? ?BP 120/82   Pulse 73   Temp 97.9 ?F (36.6 ?C) (Temporal)   Ht '5\' 8"'  (1.727 m)   Wt 153 lb (69.4 kg)   SpO2 98%   BMI 23.26 kg/m?   ? ?Vision Screening  ? Right eye Left eye Both eyes  ?Without correction '20/15 20/15 20/13 '  ?With correction     ?  ? ?CC: left sided headache  ?Subjective:  ? ?HPI: ?Christopher Pittman is a 26 y.o. male presenting on 02/03/2022 for Headache (C/o ongoing HA. Stopped Imitrex and Topamax due to causing pt to feel "weird" in the mornings. ) ? ? ?Headache started late 11/2021. Saw NP Matt several times as well as 1 virtual visit and 1 [facility] visit for this, today is fourth visit to our office for headache. Initially managed with excedrin. Has tried toradol IM, tylenol, flexeril, flonase (feels this helps), imitrex (maybe some improvement). Most recently placed on topamax 29m nightly (01/05/2022) - took for 3 weeks - felt it worsened anxiety so he stopped this. No recent tylenol use. He feels excedrin was helpful (last too this on Sunday). Takes HA medicine about twice a week. ? ?Headache described as aching and throbbing nagging discomfort not pain constantly present to left temple, worse when he wakes up in the morning, along with nasal congestion. At its worse 4/10, currently 4/10. No photo/phonophobia or nausea/vomiting, not activity limiting. Bending forward increases pressure. No vision changes, paresthesias. Denies recent trauma/injury or recent spinal procedures. No fevers/chills, new rashes, recent tick bites, joint pains, abdominal pain.  ? ?[facility] with augmentin 7d course and prednisone 3d burst - no benefit with this.  ? ?He regularly takes allergy medications daily - zyrtec in the morning, singulair at night time, and now flonase at night time - overall feels allergies well controlled on this regimen.  ? ?No personal h/o migraines.   ?Mother has migraines.  ? ?Recent Head CT returned normal.  ?Recent ESR was normal.  ? ?Alcohol - seldom  ?Smoking - none ?Rec drugs - none ? ?Edu: UWeb designer?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 after leg fracture  ?Interested in astronomy - planning to apply for [employer] internship when he finishes college this year.  ?   ? ?Relevant past medical, surgical, family and social history reviewed and updated as indicated. Interim medical history since our last visit reviewed. ?Allergies and medications reviewed and updated. ?Outpatient Medications Prior to Visit  ?Medication Sig Dispense Refill  ? acetaminophen (TYLENOL) 500 MG tablet Take 1 tablet (500 mg total) by mouth every 12 (twelve) hours. 60 tablet 0  ? cetirizine (ZYRTEC) 10 MG tablet Take 10 mg by mouth daily.    ? famotidine (PEPCID) 20 MG tablet Take 20 mg by mouth 2 (two) times daily. As needed    ? fluticasone (FLONASE) 50 MCG/ACT nasal spray Place 2 sprays into both nostrils daily. 16 g 0  ? montelukast (SINGULAIR) 10 MG tablet Take 1 tablet (10 mg total) by mouth at bedtime. 30 tablet 11  ? cyclobenzaprine (FLEXERIL) 5 MG tablet Take 1 tablet (5 mg total) by mouth at bedtime. 10 tablet 0  ? omeprazole (PRILOSEC) 20 MG capsule Take 1 capsule (20 mg total) by mouth daily. 30 capsule 0  ? SUMAtriptan (IMITREX) 50 MG tablet  Take 1 tablet at onset of headache. May repeat in 2 hours if headache persists or recurs. (Patient not taking: Reported on 02/03/2022) 10 tablet 1  ? topiramate (TOPAMAX) 25 MG tablet Take 1 tablet (25 mg total) by mouth at bedtime. (Patient not taking: Reported on 02/03/2022) 30 tablet 1  ? ?No facility-administered medications prior to visit.  ?  ? ?Per HPI unless specifically indicated in ROS section below ?Review of Systems ? ?Objective:  ?BP 120/82   Pulse 73   Temp 97.9 ?F (36.6 ?C) (Temporal)   Ht '5\' 8"'  (1.727 m)   Wt 153 lb (69.4 kg)   SpO2 98%   BMI 23.26 kg/m?   ?Wt Readings from  Last 3 Encounters:  ?02/03/22 153 lb (69.4 kg)  ?01/05/22 155 lb 7 oz (70.5 kg)  ?12/24/21 155 lb (70.3 kg)  ?  ?  ?Physical Exam ?Vitals and nursing note reviewed.  ?Constitutional:   ?   Appearance: Normal appearance. He is not ill-appearing.  ?HENT:  ?   Head: Normocephalic and atraumatic.  ?   Right Ear: Hearing, ear canal and external ear normal. There is no impacted cerumen. Tympanic membrane is scarred.  ?   Left Ear: Hearing, ear canal and external ear normal. There is no impacted cerumen. Tympanic membrane is injected, scarred and retracted.  ?   Ears:  ?   Comments: Abnormal L TM with chronic scarring, posterior TM injection/erythema/fullness ?   Nose: Nose normal. No mucosal edema, congestion or rhinorrhea.  ?   Right Turbinates: Not enlarged or swollen.  ?   Left Turbinates: Not enlarged or swollen.  ?   Right Sinus: No maxillary sinus tenderness or frontal sinus tenderness.  ?   Left Sinus: No maxillary sinus tenderness or frontal sinus tenderness.  ?   Mouth/Throat:  ?   Mouth: Mucous membranes are moist.  ?   Pharynx: Oropharynx is clear. No oropharyngeal exudate or posterior oropharyngeal erythema.  ?   Comments: No dental pain ?Eyes:  ?   Extraocular Movements: Extraocular movements intact.  ?   Conjunctiva/sclera: Conjunctivae normal.  ?   Pupils: Pupils are equal, round, and reactive to light.  ?Musculoskeletal:  ?   Cervical back: Normal range of motion and neck supple. No rigidity.  ?Lymphadenopathy:  ?   Cervical: No cervical adenopathy.  ?Skin: ?   General: Skin is warm and dry.  ?   Findings: No rash.  ?Neurological:  ?   Mental Status: He is alert.  ?   Comments:  ?CN 2-12 intact ?FTN intact  ?EOMI  ?Psychiatric:     ?   Mood and Affect: Mood normal.     ?   Behavior: Behavior normal.  ? ?   ?Results for orders placed or performed in visit on 12/25/21  ?Sedimentation rate  ?Result Value Ref Range  ? Sed Rate 4 0 - 15 mm/hr  ? ?CT HEAD WO CONTRAST (5MM) ?CLINICAL DATA:  Headache, new or  worsening sinus pain ? ?EXAM: ?CT HEAD WITHOUT CONTRAST ? ?TECHNIQUE: ?Contiguous axial images were obtained from the base of the skull ?through the vertex without intravenous contrast. ? ?RADIATION DOSE REDUCTION: This exam was performed according to the ?departmental dose-optimization program which includes automated ?exposure control, adjustment of the mA and/or kV according to ?patient size and/or use of iterative reconstruction technique. ? ?COMPARISON:  None. ? ?FINDINGS: ?Brain: No evidence of acute infarction, hemorrhage, cerebral edema, ?mass, mass effect, or midline shift. No hydrocephalus or  extra-axial ?fluid collection. ? ?Vascular: No hyperdense vessel. ? ?Skull: Normal. Negative for fracture or focal lesion. ? ?Sinuses/Orbits: Imaged sinuses are clear. The orbits are ?unremarkable. ? ?Other: The mastoid air cells are well aerated. ? ?IMPRESSION: ?IMPRESSION ?No acute intracranial process. No etiology is seen for the patient's ?headache. ? ?Electronically Signed ?  By: Merilyn Baba M.D. ?  On: 01/28/2022 14:02 ?  ?Assessment & Plan:  ? ?Problem List Items Addressed This Visit   ? ? Left-sided headache - Primary  ?  Ongoing left sided headache for the past 2 months, s/p unsuccessful treatments to date including flexeril, NSAIDs, imitrex, toradol IM, and ppx topamax.  Headaches are not consistent with migraines or TTH or MOH.  He endorses overall mild discomfort. ?Recent head CT reassuring, recent ESR normal. ?Given headache is same side as abnormal TM after ear surgery, I did recommend he see his ENT for a thorough ear evaluation and to ensure left-sided discomfort is not stemming from ear. ?Visual acuity testing today normal - still do recommend he complete formal eye exam with eye doctor. ?No additional medicines at this time.  Patient agrees with plan. ? ?  ?  ? Abnormal tympanic membrane of left ear  ?  Status post tympanostomy 2021 Wilburn Cornelia) for chronic TM perforation.  I did recommend return to  ENT for repeat ear evaluation as there seems to be some chronic inflammation behind the posterior left TM. ? ?  ?  ? Relevant Orders  ? Ambulatory referral to ENT  ?  ? ?No orders of the defined types were placed in this

## 2022-02-03 NOTE — Patient Instructions (Signed)
Vision screen today. Schedule eye exam.  ?Left ear looks inflamed/irritated. Return to see ear doctor to ensure no source of headache there. I have placed a new ENT referral. If normal, we may refer you to headache clinic.  ?Avoid frequent use of pain meds like tylenol, ibuprofen, aleve.  ?Good to see you today.  ?

## 2022-02-04 DIAGNOSIS — H7392 Unspecified disorder of tympanic membrane, left ear: Secondary | ICD-10-CM | POA: Insufficient documentation

## 2022-02-04 NOTE — Assessment & Plan Note (Addendum)
Ongoing left sided headache for the past 2 months, s/p unsuccessful treatments to date including flexeril, NSAIDs, imitrex, toradol IM, and ppx topamax.  Headaches are not consistent with migraines or TTH or MOH.  He endorses overall mild discomfort. ?Recent head CT reassuring, recent ESR normal. ?Given headache is same side as abnormal TM after ear surgery, I did recommend he see his ENT for a thorough ear evaluation and to ensure left-sided discomfort is not stemming from ear. ?Visual acuity testing today normal - still do recommend he complete formal eye exam with eye doctor. ?No additional medicines at this time.  Patient agrees with plan. ?

## 2022-02-04 NOTE — Assessment & Plan Note (Signed)
Status post tympanostomy 2021 Christopher Pittman) for chronic TM perforation.  I did recommend return to ENT for repeat ear evaluation as there seems to be some chronic inflammation behind the posterior left TM. ?

## 2022-02-05 ENCOUNTER — Other Ambulatory Visit (INDEPENDENT_AMBULATORY_CARE_PROVIDER_SITE_OTHER): Payer: BC Managed Care – PPO

## 2022-02-05 DIAGNOSIS — Z13220 Encounter for screening for lipoid disorders: Secondary | ICD-10-CM | POA: Diagnosis not present

## 2022-02-05 NOTE — Progress Notes (Signed)
error 

## 2022-02-06 ENCOUNTER — Encounter: Payer: Self-pay | Admitting: Family Medicine

## 2022-02-06 LAB — LIPID PANEL
Cholesterol: 243 mg/dL — ABNORMAL HIGH (ref 0–200)
HDL: 78.8 mg/dL (ref >39.00)
LDL Cholesterol: 154 mg/dL — ABNORMAL HIGH (ref 0–99)
NonHDL: 163.89
Total CHOL/HDL Ratio: 3
Triglycerides: 51 mg/dL (ref 0.0–149.0)
VLDL: 10.2 mg/dL (ref 0.0–40.0)

## 2022-02-09 ENCOUNTER — Encounter: Payer: Self-pay | Admitting: Family Medicine

## 2022-02-12 ENCOUNTER — Other Ambulatory Visit: Payer: BC Managed Care – PPO

## 2022-02-12 ENCOUNTER — Encounter: Payer: Self-pay | Admitting: Family Medicine

## 2022-02-12 NOTE — Progress Notes (Signed)
Addressed via separate mychart message

## 2022-02-12 NOTE — Progress Notes (Signed)
Addressed via result note 

## 2022-02-13 ENCOUNTER — Encounter: Payer: Self-pay | Admitting: *Deleted

## 2022-03-10 ENCOUNTER — Encounter: Payer: Self-pay | Admitting: Family Medicine

## 2022-03-15 ENCOUNTER — Encounter: Payer: Self-pay | Admitting: Nurse Practitioner

## 2022-03-16 NOTE — Telephone Encounter (Signed)
Replied via other mychart message.  

## 2022-03-17 DIAGNOSIS — R519 Headache, unspecified: Secondary | ICD-10-CM | POA: Diagnosis not present

## 2022-03-17 DIAGNOSIS — Z9889 Other specified postprocedural states: Secondary | ICD-10-CM | POA: Diagnosis not present

## 2022-03-18 ENCOUNTER — Ambulatory Visit: Payer: BC Managed Care – PPO | Admitting: Family Medicine

## 2022-03-26 ENCOUNTER — Ambulatory Visit: Payer: BC Managed Care – PPO | Admitting: Nurse Practitioner

## 2022-03-30 ENCOUNTER — Ambulatory Visit: Payer: BC Managed Care – PPO | Admitting: Nurse Practitioner

## 2022-03-30 VITALS — BP 134/74 | HR 70 | Temp 97.1°F | Resp 12 | Ht 68.0 in | Wt 152.5 lb

## 2022-03-30 DIAGNOSIS — R1013 Epigastric pain: Secondary | ICD-10-CM | POA: Diagnosis not present

## 2022-03-30 DIAGNOSIS — K30 Functional dyspepsia: Secondary | ICD-10-CM | POA: Diagnosis not present

## 2022-03-30 DIAGNOSIS — R0609 Other forms of dyspnea: Secondary | ICD-10-CM | POA: Insufficient documentation

## 2022-03-30 MED ORDER — OMEPRAZOLE 20 MG PO CPDR: 20.0000 mg | DELAYED_RELEASE_CAPSULE | Freq: Every day | ORAL | 0 refills | Status: DC

## 2022-03-30 NOTE — Assessment & Plan Note (Signed)
1 question about shortness of breath in the review of systems patient endorsed DOE.  Has had surgery back in October for a large bone fracture has not been back to work as of yet low risk for blood clot at this point do feel like this is secondary to deconditioning did discuss this with patient

## 2022-03-30 NOTE — Patient Instructions (Signed)
Nice to see you today This medication can take 10-14 days to have good effect Let us know if you are not improving.

## 2022-03-30 NOTE — Assessment & Plan Note (Signed)
Patient endorses more indigestion as of late inclusive of increased belching.  Will place patient on omeprazole 20 mg daily.  Follow-up if no improvement

## 2022-03-30 NOTE — Progress Notes (Signed)
Acute Office Visit  Subjective:     Patient ID: Christopher Pittman, male    DOB: [birth year], 26 y.o.   MRN: [id number]  Chief Complaint  Patient presents with   Abdominal Pain    Has had discomfort feeling in the upper stomach area x 2 weeks. No nausea, no diarrhea or vomiting.     Patient is in today for chest and belly discomfort  States he noticed it approx 2 weeks ago and is all the time. Aching sensation. States that drinking may help it a little bit. Nothing has made it worse. States he took a pepcid sometime last week without relief. He has tried tylenol and ibuprofen with out relief  He does not drink alcohol. He has not been using NSAID medications regularly. Has noticed an increase in burping. No metallic or bad taste in his mouth that morning   Review of Systems  Constitutional:  Negative for chills and fever.  Respiratory:  Positive for shortness of breath (doe). Negative for cough.   Cardiovascular:  Negative for chest pain.  Gastrointestinal:  Positive for abdominal pain and heartburn. Negative for blood in stool, constipation, diarrhea, nausea and vomiting.       BM yesterday that was normal for you  Genitourinary:  Negative for dysuria and hematuria.        Objective:    BP 134/74   Pulse 70   Temp (!) 97.1 F (36.2 C)   Resp 12   Ht 5\' 8"  (1.727 m)   Wt 152 lb 8 oz (69.2 kg)   SpO2 98%   BMI 23.19 kg/m    Physical Exam Vitals and nursing note reviewed.  Constitutional:      Appearance: He is well-developed.  Cardiovascular:     Rate and Rhythm: Normal rate and regular rhythm.  Pulmonary:     Effort: Pulmonary effort is normal.     Breath sounds: Normal breath sounds.  Abdominal:     General: Bowel sounds are normal.     Tenderness: There is abdominal tenderness in the epigastric area.  Neurological:     Mental Status: He is alert.     No results found for any visits on 03/30/22.      Assessment & Plan:   Problem List Items Addressed  This Visit       Other   Epigastric pain    Has tried Pepcid 1 time for the past 2 weeks without great relief.  Patient has been seen by GI and EGD was performed 09/30/2021 that was within normal limits.  We will start patient on omeprazole 20 mg daily for 1 month.  Follow-up if no improvement      Indigestion    Patient endorses more indigestion as of late inclusive of increased belching.  Will place patient on omeprazole 20 mg daily.  Follow-up if no improvement      Relevant Medications   omeprazole (PRILOSEC) 20 MG capsule   DOE (dyspnea on exertion)    1 question about shortness of breath in the review of systems patient endorsed DOE.  Has had surgery back in October for a large bone fracture has not been back to work as of yet low risk for blood clot at this point do feel like this is secondary to deconditioning did discuss this with patient      Other Visit Diagnoses     Epigastric discomfort    -  Primary   Relevant Medications   omeprazole (PRILOSEC) 20  MG capsule       Meds ordered this encounter  Medications   omeprazole (PRILOSEC) 20 MG capsule    Sig: Take 1 capsule (20 mg total) by mouth daily.    Dispense:  30 capsule    Refill:  0    Order Specific Question:   Supervising Provider    Answer:   TOWER, MARNE A [[id number]    Return if symptoms worsen or fail to improve.  Audria Nine, NP

## 2022-03-30 NOTE — Assessment & Plan Note (Signed)
Has tried Pepcid 1 time for the past 2 weeks without great relief.  Patient has been seen by GI and EGD was performed 09/30/2021 that was within normal limits.  We will start patient on omeprazole 20 mg daily for 1 month.  Follow-up if no improvement

## 2022-04-08 ENCOUNTER — Encounter: Payer: Self-pay | Admitting: Family Medicine

## 2022-04-08 ENCOUNTER — Ambulatory Visit: Payer: BC Managed Care – PPO | Admitting: Family Medicine

## 2022-04-08 DIAGNOSIS — R519 Headache, unspecified: Secondary | ICD-10-CM

## 2022-04-22 ENCOUNTER — Telehealth: Payer: Self-pay

## 2022-04-22 NOTE — Telephone Encounter (Signed)
Can we triage to make sure what discomfort his is having please

## 2022-04-22 NOTE — Telephone Encounter (Signed)
Patient called and said she was returning Rena's call, callback is [phone]

## 2022-04-22 NOTE — Telephone Encounter (Signed)
I spoke with pt see other pt message.this is note. I spoke with pt and he has had upper abd or chest pain on and off for 4 months. Pt said omeprazole has not helped.pt seen 03/30/22. Pt said is not sure if radiates to arm but rt shoulder hurts on and off. Pt said has slight SOB but also has asthma. No fever or cough and no N&V or sweats. Pt scheduled appt with Dr Reece Agar on 04/24/22 at 2PM(Dr G oked appt) pt will be at [facility] at 1:45 to register. UC & ED precautions given and pt voiced understanding.Sending note to Dr Reece Agar and Misty Stanley CMA.

## 2022-04-22 NOTE — Telephone Encounter (Addendum)
I spoke with pt and he has had upper abd or chest pain on and off for 4 months. Pt said omeprazole has not helped.pt seen 03/30/22. Pt said is not sure if radiates to arm but rt shoulder hurts on and off. Pt said has slight SOB but also has asthma. No fever or cough and no N&V or sweats. Pt scheduled appt with Dr Reece Agar on 04/24/22 at 2PM(Dr G oked appt) pt will be at [facility] at 1:45 to register. UC & ED precautions given and pt voiced understanding.Sending note to Dr Reece Agar and Misty Stanley CMA.

## 2022-04-22 NOTE — Telephone Encounter (Signed)
error 

## 2022-04-23 ENCOUNTER — Other Ambulatory Visit: Payer: Self-pay | Admitting: Nurse Practitioner

## 2022-04-23 DIAGNOSIS — K30 Functional dyspepsia: Secondary | ICD-10-CM

## 2022-04-23 DIAGNOSIS — R1013 Epigastric pain: Secondary | ICD-10-CM

## 2022-04-24 ENCOUNTER — Encounter: Payer: Self-pay | Admitting: Family Medicine

## 2022-04-24 ENCOUNTER — Ambulatory Visit: Payer: BC Managed Care – PPO | Admitting: Family Medicine

## 2022-04-24 VITALS — BP 116/84 | HR 87 | Temp 97.7°F | Ht 68.0 in | Wt 151.4 lb

## 2022-04-24 DIAGNOSIS — R0789 Other chest pain: Secondary | ICD-10-CM

## 2022-04-24 MED ORDER — NAPROXEN 375 MG PO TABS: 375.0000 mg | ORAL_TABLET | Freq: Two times a day (BID) | ORAL | 0 refills | Status: DC

## 2022-04-24 NOTE — Progress Notes (Signed)
Patient ID: Christopher Pittman, male    DOB: [birth year], 26 y.o.   MRN: [id number]  This visit was conducted in person.  BP 116/84   Pulse 87   Temp 97.7 F (36.5 C) (Temporal)   Ht 5\' 8"  (1.727 m)   Wt 151 lb 6 oz (68.7 kg)   SpO2 98%   BMI 23.02 kg/m    CC: abd pain, sternal pain  Subjective:   HPI: Christopher Pittman is a 26 y.o. male presenting on 04/24/2022 for Abdominal Pain (Here for f/u of abd pain in epigastric area and sternum pain. )   Remains out of work.   Here today to discuss ongoing chronic epigastric abdominal pain and sternal pain. Ongoing symptoms for 6+ months, not worsened but not improved either. Epigastric, substernal discomfort with some radiation to bilateral posterior shoulders. Dull ache, sometimes tight feeling. Feels well when he wakes up but then around 2pm starts to notice discomfort, can happen every 2 days. Occasional dyspnea associated with discomfort, not exertional. Not sharp, stabbing, no burning. No fevers/chills, nausea, diarrhea, vomiting, cough, palpitations. Denies inciting trauma/injjry or falls.   Endorses h/o asthma as a child age ~82yo, no flare in years.  He does have albuterol inhaler but doesn't need to use.  Known allergic rhinitis, managed with Singulair and Zyrtec, symptoms usually worse in the summers.  Pepcid/omeprazole haven't help.   Previous meds tried include Pepcid milligrams twice daily, omeprazole 20 mg daily without any significant improvement had difficulty tolerating Carafate due to large size of pill. Has undergone extensive work-up including EKGs, CT angio chest x2 and GI work-up including reassuring upper endoscopy 09/2021.  He underwent left tib-fib fracture ORIF and rod placement 07/2021.  Home workout - push ups, sit ups, weight resistance training (40 lbs) - bicep curls, exercise bike. Exercises every 2 days. Doesn't think pain is related to work out routine.      Relevant past medical, surgical, family and social  history reviewed and updated as indicated. Interim medical history since our last visit reviewed. Allergies and medications reviewed and updated. Outpatient Medications Prior to Visit  Medication Sig Dispense Refill   acetaminophen (TYLENOL) 500 MG tablet Take 1 tablet (500 mg total) by mouth every 12 (twelve) hours. 60 tablet 0   cetirizine (ZYRTEC) 10 MG tablet Take 10 mg by mouth daily.     fluticasone (FLONASE) 50 MCG/ACT nasal spray Place 2 sprays into both nostrils daily. 16 g 0   montelukast (SINGULAIR) 10 MG tablet Take 1 tablet (10 mg total) by mouth at bedtime. 30 tablet 11   famotidine (PEPCID) 20 MG tablet Take 20 mg by mouth 2 (two) times daily. As needed     omeprazole (PRILOSEC) 20 MG capsule Take 1 capsule (20 mg total) by mouth daily. 30 capsule 0   No facility-administered medications prior to visit.     Per HPI unless specifically indicated in ROS section below Review of Systems  Objective:  BP 116/84   Pulse 87   Temp 97.7 F (36.5 C) (Temporal)   Ht 5\' 8"  (1.727 m)   Wt 151 lb 6 oz (68.7 kg)   SpO2 98%   BMI 23.02 kg/m   Wt Readings from Last 3 Encounters:  04/24/22 151 lb 6 oz (68.7 kg)  03/30/22 152 lb 8 oz (69.2 kg)  02/03/22 153 lb (69.4 kg)      Physical Exam Vitals and nursing note reviewed.  Constitutional:      Appearance: Normal appearance.  He is well-developed. He is not ill-appearing.  HENT:     Head: Normocephalic and atraumatic.     Mouth/Throat:     Mouth: Mucous membranes are moist.     Pharynx: Oropharynx is clear. No oropharyngeal exudate or posterior oropharyngeal erythema.  Eyes:     Extraocular Movements: Extraocular movements intact.     Pupils: Pupils are equal, round, and reactive to light.  Cardiovascular:     Rate and Rhythm: Normal rate and regular rhythm.     Pulses: Normal pulses.     Heart sounds: Normal heart sounds. No murmur heard. Pulmonary:     Effort: Pulmonary effort is normal. No respiratory distress.      Breath sounds: Normal breath sounds. No wheezing, rhonchi or rales.  Chest:     Chest wall: Tenderness present. No mass or swelling.       Comments: Tender to palpation mid sternum x2, as well as at R 2nd costochondral junction Abdominal:     General: Bowel sounds are normal. There is no distension.     Palpations: Abdomen is soft. There is no mass.     Tenderness: There is no abdominal tenderness. There is no guarding or rebound.     Hernia: No hernia is present.  Musculoskeletal:     Right lower leg: No edema.     Left lower leg: No edema.  Lymphadenopathy:     Upper Body:     Right upper body: No supraclavicular adenopathy.     Left upper body: No supraclavicular adenopathy.  Skin:    General: Skin is warm and dry.     Findings: No rash.  Neurological:     Mental Status: He is alert.  Psychiatric:        Mood and Affect: Mood normal.        Behavior: Behavior normal.       Results for orders placed or performed in visit on 02/05/22  Lipid panel  Result Value Ref Range   Cholesterol 243 (H) 0 - 200 mg/dL   Triglycerides 62.9 0.0 - 149.0 mg/dL   HDL 52.84 >13.24 mg/dL   VLDL 40.1 0.0 - 02.7 mg/dL   LDL Cholesterol 253 (H) 0 - 99 mg/dL   Total CHOL/HDL Ratio 3    NonHDL 163.89     Assessment & Plan:   Problem List Items Addressed This Visit     Non-cardiac chest pain - Primary    Story/exam most consistent with MSK cause - anticipate sternalis syndrome as well as possible R sided costochondritis.  rec supportive measures with topical voltaren, ice/heat, gentle stretching, Rx naprosyn 375mg  BID course - update with effect. Reviewed reassuring workup to date otherwise.  If ongoing, will recommend PT course vs sports medicine eval.         Meds ordered this encounter  Medications   naproxen (NAPROSYN) 375 MG tablet    Sig: Take 1 tablet (375 mg total) by mouth 2 (two) times daily with a meal.    Dispense:  40 tablet    Refill:  0   No orders of the defined  types were placed in this encounter.    Patient instructions: I think you may have component of costochondritis on right chest as well as sternalis syndrome - pain of sternum.  Treat with gentle stretching of chest wall.  Take naprosyn anti inflammatory course sent to pharmacy.  Continue voltaren gel pea sized amount to tender areas on chest wall 2-3 times a day.  Use pain as guide - if an exercise you're doing hurts, stop it.   Follow up plan: Return if symptoms worsen or fail to improve.  Eustaquio Boyden, MD

## 2022-04-24 NOTE — Patient Instructions (Addendum)
I think you may have component of costochondritis on right chest as well as sternalis syndrome - pain of sternum.  Treat with gentle stretching of chest wall.  Take naprosyn anti inflammatory course sent to pharmacy.  Continue voltaren gel pea sized amount to tender areas on chest wall 2-3 times a day.  Use pain as guide - if an exercise you're doing hurts, stop it.   Costochondritis  Costochondritis is inflammation of the tissue (cartilage) that connects the ribs to the breastbone (sternum). This causes pain in the front of the chest. The pain usually starts slowly and involves more than one rib. What are the causes? The exact cause of this condition is not always known. It results from stress on the cartilage where your ribs attach to your sternum. The cause of this stress could be: Chest injury. Exercise or activity, such as lifting. Severe coughing. What increases the risk? You are more likely to develop this condition if you: Are male. Are 28-22 years old. Recently started a new exercise or work activity. Have low levels of vitamin D. Have a condition that makes you cough frequently. What are the signs or symptoms? The main symptom of this condition is chest pain. The pain: Usually starts gradually and can be sharp or dull. Gets worse with deep breathing, coughing, or exercise. Gets better with rest. May be worse when you press on the affected area of your ribs and sternum. How is this diagnosed? This condition is diagnosed based on your symptoms, your medical history, and a physical exam. Your health care provider will check for pain when pressing on your sternum. You may also have tests to rule out other causes of chest pain. These may include: A chest X-ray to check for lung problems. An ECG (electrocardiogram) to see if you have a heart problem that could be causing the pain. An imaging scan to rule out a chest or rib fracture. How is this treated? This condition usually  goes away on its own over time. Your health care provider may prescribe an NSAID, such as ibuprofen, to reduce pain and inflammation. Treatment may also include: Resting and avoiding activities that make pain worse. Applying heat or ice to the area to reduce pain and inflammation. Doing exercises to stretch your chest muscles. If these treatments do not help, your health care provider may inject a numbing medicine at the sternum-rib connection to help relieve the pain. Follow these instructions at home: Managing pain, stiffness, and swelling     If directed, put ice on the painful area. To do this: Put ice in a plastic bag. Place a towel between your skin and the bag. Leave the ice on for 20 minutes, 2-3 times a day. If directed, apply heat to the affected area as often as told by your health care provider. Use the heat source that your health care provider recommends, such as a moist heat pack or a heating pad. Place a towel between your skin and the heat source. Leave the heat on for 20-30 minutes. Remove the heat if your skin turns bright red. This is especially important if you are unable to feel pain, heat, or cold. You may have a greater risk of getting burned. Activity Rest as told by your health care provider. Avoid activities that make pain worse. This includes any activities that use chest, abdominal, and side muscles. Do not lift anything that is heavier than 10 lb (4.5 kg), or the limit that you are told, until  your health care provider says that it is safe. Return to your normal activities as told by your health care provider. Ask your health care provider what activities are safe for you. General instructions Take over-the-counter and prescription medicines only as told by your health care provider. Keep all follow-up visits as told by your health care provider. This is important. Contact a health care provider if: You have chills or a fever. Your pain does not go away or it  gets worse. You have a cough that does not go away. Get help right away if: You have shortness of breath. You have severe chest pain that is not relieved by medicines, heat, or ice. These symptoms may represent a serious problem that is an emergency. Do not wait to see if the symptoms will go away. Get medical help right away. Call your local emergency services (911 in the [place] Do not drive yourself to the hospital.  Summary Costochondritis is inflammation of the tissue (cartilage) that connects the ribs to the breastbone (sternum). This condition causes pain in the front of the chest. Costochondritis results from stress on the cartilage where your ribs attach to your sternum. Treatment may include medicines, rest, heat or ice, and exercises. This information is not intended to replace advice given to you by your health care provider. Make sure you discuss any questions you have with your health care provider. Document Revised: 08/11/2019 Document Reviewed: 08/11/2019 Elsevier Patient Education  2023 [employer].

## 2022-04-24 NOTE — Assessment & Plan Note (Addendum)
Story/exam most consistent with MSK cause - anticipate sternalis syndrome as well as possible R sided costochondritis.  rec supportive measures with topical voltaren, ice/heat, gentle stretching, Rx naprosyn 375mg  BID course - update with effect. Reviewed reassuring workup to date otherwise.  If ongoing, will recommend PT course vs sports medicine eval.

## 2022-04-28 ENCOUNTER — Encounter: Payer: Self-pay | Admitting: Family Medicine

## 2022-05-10 ENCOUNTER — Encounter: Payer: Self-pay | Admitting: Nurse Practitioner

## 2022-05-11 NOTE — Telephone Encounter (Signed)
Defer to PCP

## 2022-05-12 ENCOUNTER — Encounter: Payer: Self-pay | Admitting: Family Medicine

## 2022-05-12 ENCOUNTER — Telehealth (INDEPENDENT_AMBULATORY_CARE_PROVIDER_SITE_OTHER): Payer: BC Managed Care – PPO | Admitting: Family Medicine

## 2022-05-12 DIAGNOSIS — U071 COVID-19: Secondary | ICD-10-CM | POA: Insufficient documentation

## 2022-05-12 HISTORY — DX: COVID-19: U07.1

## 2022-05-12 MED ORDER — MOLNUPIRAVIR EUA 200MG CAPSULE: 4.0000 | ORAL_CAPSULE | Freq: Two times a day (BID) | ORAL | 0 refills | Status: AC

## 2022-05-12 NOTE — Telephone Encounter (Signed)
Pt said tested + with home test for covid on 05/11/22; started with symptoms on 05/11/22 with dry cough, scratchy S/t and runny nose.temp on 05/11/22 was 99. No CP,SOB or wheezing. Pt scheduled VV with Dr Reece Agar on 05/12/22 at 4:30. Self quarantine, drink plenty of fluids, rest, and take Tylenol for fever. UC & ED precautions given and pt voiced understanding.  Sending note to Dr Reece Agar and Misty Stanley CMA.

## 2022-05-12 NOTE — Telephone Encounter (Signed)
Seen today. 

## 2022-05-12 NOTE — Assessment & Plan Note (Addendum)
Reviewed currently approved antiviral treatments.  Reviewed expected course of illness, anticipated course of recovery, as well as red flags to suggest COVID pneumonia and/or to seek urgent in-person care.  Reviewed latest CDC isolation/quarantine guidelines.  Encouraged fluids and rest. Reviewed further supportive care measures at home including vit C 500mg  bid, vit D 2000 IU daily, zinc 100mg  daily, tylenol PRN, pepcid 20mg  BID PRN.   Recommend:  Supportive measures as per below. Given no risk factors for complicated COVID, discussed should do well without needing antiviral. WASP for molnupiravir sent to pharmacy in case any acute worsening over next 24-48 hours, otherwise no need for additional med.

## 2022-05-12 NOTE — Progress Notes (Signed)
Patient ID: Christopher Pittman, male    DOB: 15-Dec-1995, 26 y.o.   MRN: [id number]  Virtual visit completed through MyChart, a video enabled telemedicine application. Due to national recommendations of social distancing due to COVID-19, a virtual visit is felt to be most appropriate for this patient at this time. Reviewed limitations, risks, security and privacy concerns of performing a virtual visit and the availability of in person appointments. I also reviewed that there may be a patient responsible charge related to this service. The patient agreed to proceed.   Patient location: home Provider location:  at [facility], office Persons participating in this virtual visit: patient, provider   If any vitals were documented, they were collected by patient at home unless specified below.    BP 118/77   Pulse 88   Temp 98.6 F (37 C)   Ht 5\' 8"  (1.727 m)   Wt 155 lb (70.3 kg)   BMI 23.57 kg/m    CC: COVID symptoms  Subjective:   HPI: Christopher Pittman is a 26 y.o. male presenting on 05/12/2022 for Covid Positive (C/o dry cough, ST, runny nose and fever- max 99. Sxs started 05/11/22. Pos home COVID test on 05/11/22.)   First day of symptoms: 05/10/2022 Tested COVID positive: 05/11/2022  Current symptoms: dry cough, ST, runny nose and congestion. Tmax 99.8.  No: ear or tooth pain, chest pain, new shortness of breath, loss of taste/smell, abd pain, nausea, diarrhea.  Treatments to date: tylenol.  Risk factors include: none  This is second time he's gotten COVID.   No sick contacts at home.   COVID vaccination status: Pfizer x3, including booster latest 10/2020      Relevant past medical, surgical, family and social history reviewed and updated as indicated. Interim medical history since our last visit reviewed. Allergies and medications reviewed and updated. Outpatient Medications Prior to Visit  Medication Sig Dispense Refill   acetaminophen (TYLENOL) 500 MG tablet Take 1 tablet  (500 mg total) by mouth every 12 (twelve) hours. (Patient taking differently: Take 500 mg by mouth every 12 (twelve) hours. As needed) 60 tablet 0   cetirizine (ZYRTEC) 10 MG tablet Take 10 mg by mouth daily.     fluticasone (FLONASE) 50 MCG/ACT nasal spray Place 2 sprays into both nostrils daily. 16 g 0   montelukast (SINGULAIR) 10 MG tablet Take 1 tablet (10 mg total) by mouth at bedtime. 30 tablet 11   naproxen (NAPROSYN) 375 MG tablet Take 1 tablet (375 mg total) by mouth 2 (two) times daily with a meal. 40 tablet 0   No facility-administered medications prior to visit.     Per HPI unless specifically indicated in ROS section below Review of Systems Objective:  BP 118/77   Pulse 88   Temp 98.6 F (37 C)   Ht 5\' 8"  (1.727 m)   Wt 155 lb (70.3 kg)   BMI 23.57 kg/m   Wt Readings from Last 3 Encounters:  05/12/22 155 lb (70.3 kg)  04/24/22 151 lb 6 oz (68.7 kg)  03/30/22 152 lb 8 oz (69.2 kg)       Physical exam: Gen: alert, NAD, not ill appearing Pulm: speaks in complete sentences without increased work of breathing Psych: normal mood, normal thought content      Results for orders placed or performed in visit on 02/05/22  Lipid panel  Result Value Ref Range   Cholesterol 243 (H) 0 - 200 mg/dL   Triglycerides 04/01/22 0.0 - 149.0  mg/dL   HDL 62.37 >62.83 mg/dL   VLDL 15.1 0.0 - 76.1 mg/dL   LDL Cholesterol 607 (H) 0 - 99 mg/dL   Total CHOL/HDL Ratio 3    NonHDL 163.89    Assessment & Plan:   Problem List Items Addressed This Visit     COVID-19 virus infection    Reviewed currently approved antiviral treatments.  Reviewed expected course of illness, anticipated course of recovery, as well as red flags to suggest COVID pneumonia and/or to seek urgent in-person care.  Reviewed latest CDC isolation/quarantine guidelines.  Encouraged fluids and rest. Reviewed further supportive care measures at home including vit C 500mg  bid, vit D 2000 IU daily, zinc 100mg  daily, tylenol  PRN, pepcid 20mg  BID PRN.   Recommend:  Supportive measures as per below. Given no risk factors for complicated COVID, discussed should do well without needing antiviral. WASP for molnupiravir sent to pharmacy in case any acute worsening over next 24-48 hours, otherwise no need for additional med.       Relevant Medications   molnupiravir EUA (LAGEVRIO) 200 mg CAPS capsule     Meds ordered this encounter  Medications   molnupiravir EUA (LAGEVRIO) 200 mg CAPS capsule    Sig: Take 4 capsules (800 mg total) by mouth 2 (two) times daily for 5 days.    Dispense:  40 capsule    Refill:  0   No orders of the defined types were placed in this encounter.   I discussed the assessment and treatment plan with the patient. The patient was provided an opportunity to ask questions and all were answered. The patient agreed with the plan and demonstrated an understanding of the instructions. The patient was advised to call back or seek an in-person evaluation if the symptoms worsen or if the condition fails to improve as anticipated.  Follow up plan: No follow-ups on file.  , MD

## 2022-05-22 NOTE — Telephone Encounter (Signed)
Plz schedule appt with me for next week or Dr Patsy Lager when available for presumed MSK chest pain but recurrent after COVID.

## 2022-05-25 NOTE — Telephone Encounter (Signed)
Spoke with pt scheduling OV on 05/27/22 with Dr. Reece Agar.

## 2022-05-27 ENCOUNTER — Encounter: Payer: Self-pay | Admitting: Family Medicine

## 2022-05-27 ENCOUNTER — Ambulatory Visit: Payer: BC Managed Care – PPO | Admitting: Family Medicine

## 2022-05-27 ENCOUNTER — Ambulatory Visit (INDEPENDENT_AMBULATORY_CARE_PROVIDER_SITE_OTHER)
Admission: RE | Admit: 2022-05-27 | Discharge: 2022-05-27 | Disposition: A | Discharge status: Home / Self Care | Payer: BC Managed Care – PPO | Source: Ambulatory Visit | Attending: Family Medicine | Admitting: Family Medicine

## 2022-05-27 VITALS — BP 116/78 | HR 85 | Temp 97.5°F | Ht 68.0 in | Wt 155.4 lb

## 2022-05-27 DIAGNOSIS — R0609 Other forms of dyspnea: Secondary | ICD-10-CM | POA: Diagnosis not present

## 2022-05-27 DIAGNOSIS — R079 Chest pain, unspecified: Secondary | ICD-10-CM | POA: Diagnosis not present

## 2022-05-27 DIAGNOSIS — U099 Post covid-19 condition, unspecified: Secondary | ICD-10-CM

## 2022-05-27 DIAGNOSIS — R0789 Other chest pain: Secondary | ICD-10-CM | POA: Diagnosis not present

## 2022-05-27 DIAGNOSIS — R0600 Dyspnea, unspecified: Secondary | ICD-10-CM | POA: Diagnosis not present

## 2022-05-27 NOTE — Patient Instructions (Signed)
Chest xray today Peak flow today  Continue albuterol inhaler as needed.  Anticipate shortness of breath and chest discomfort sensation to improve with time, possibly residual inflammation/irrigation of lungs after COVID.

## 2022-05-27 NOTE — Progress Notes (Signed)
Patient ID: Christopher Pittman, male    DOB: 03-29-1996, 26 y.o.   MRN: [id number]  This visit was conducted in person.  BP 116/78   Pulse 85   Temp (!) 97.5 F (36.4 C) (Temporal)   Ht 5\' 8"  (1.727 m)   Wt 155 lb 6 oz (70.5 kg)   SpO2 97%   PF 520 L/min Comment: 1st- 520, 2nd- 510, 3rd- 470  BMI 23.62 kg/m    CC: chest pain, dyspnea after COVID Subjective:   HPI: Jakel Alphin is a 26 y.o. male presenting on 05/27/2022 for Chest Pain (C/o ongoing chest pain and SOB post-COVID. )   Seen here last month with chest pain thought non-cardiac MSK pain most consistent with sternalis syndrome and R costochondritis - treated with voltaren topically, ice/heat, Rx naprosyn 375mg  BID with overall improvement.   Subsequently developed COVID infection 05/10/2022, 2nd infection. Offered molnupiravir however he didn't take given low risk status.  COVID vaccination status: Pfizer x3, including booster latest 10/2020  However since COVID notes discomfort to left upper chest as well as right mid chest - described as fullness sensation associated with shortness of breath, no true pain, soreness or nagging discomfort. Also notes nagging pain to epigastric. No burning pain. No cough, wheezing. Appetite ok. No weight changes. No fevers/chills, nausea, vomiting, BM changes, dizziness, palpitations.   No other post-COVID symptoms of fatigue, brain fog, loss of taste/smell, cough.      Relevant past medical, surgical, family and social history reviewed and updated as indicated. Interim medical history since our last visit reviewed. Allergies and medications reviewed and updated. Outpatient Medications Prior to Visit  Medication Sig Dispense Refill   acetaminophen (TYLENOL) 500 MG tablet Take 1 tablet (500 mg total) by mouth every 12 (twelve) hours. (Patient taking differently: Take 500 mg by mouth every 12 (twelve) hours. As needed) 60 tablet 0   albuterol (VENTOLIN HFA) 108 (90 Base) MCG/ACT inhaler Inhale  2 puffs into the lungs every 6 (six) hours as needed for wheezing or shortness of breath. 8 g 0   cetirizine (ZYRTEC) 10 MG tablet Take 10 mg by mouth daily.     fluticasone (FLONASE) 50 MCG/ACT nasal spray Place 2 sprays into both nostrils daily. 16 g 0   montelukast (SINGULAIR) 10 MG tablet Take 1 tablet (10 mg total) by mouth at bedtime. 30 tablet 11   No facility-administered medications prior to visit.     Per HPI unless specifically indicated in ROS section below Review of Systems  Objective:  BP 116/78   Pulse 85   Temp (!) 97.5 F (36.4 C) (Temporal)   Ht 5\' 8"  (1.727 m)   Wt 155 lb 6 oz (70.5 kg)   SpO2 97%   PF 520 L/min Comment: 1st- 520, 2nd- 510, 3rd- 470  BMI 23.62 kg/m   Wt Readings from Last 3 Encounters:  05/27/22 155 lb 6 oz (70.5 kg)  05/12/22 155 lb (70.3 kg)  04/24/22 151 lb 6 oz (68.7 kg)      Physical Exam Vitals and nursing note reviewed.  Constitutional:      Appearance: Normal appearance. He is not ill-appearing.  HENT:     Head: Normocephalic and atraumatic.     Mouth/Throat:     Mouth: Mucous membranes are moist.     Pharynx: Oropharynx is clear. No oropharyngeal exudate or posterior oropharyngeal erythema.  Eyes:     Extraocular Movements: Extraocular movements intact.     Pupils: Pupils are  equal, round, and reactive to light.  Neck:     Thyroid: No thyroid mass or thyromegaly.  Cardiovascular:     Rate and Rhythm: Normal rate and regular rhythm.     Pulses: Normal pulses.     Heart sounds: Normal heart sounds. No murmur heard. Pulmonary:     Effort: Pulmonary effort is normal. No respiratory distress.     Breath sounds: Normal breath sounds. No wheezing, rhonchi or rales.  Chest:     Chest wall: No tenderness.  Musculoskeletal:     Cervical back: Normal range of motion. No rigidity.     Right lower leg: No edema.     Left lower leg: No edema.  Lymphadenopathy:     Cervical: No cervical adenopathy.  Skin:    General: Skin is warm  and dry.     Findings: No rash.  Neurological:     Mental Status: He is alert.  Psychiatric:        Mood and Affect: Mood normal.        Behavior: Behavior normal.        Assessment & Plan:   Problem List Items Addressed This Visit     Non-cardiac chest pain    Thought costochondritis + sternalis syndrome. This has largely improved with topical and oral NSAID course.       Persistent dyspnea after COVID-19 - Primary    Notes persistent dyspnea associated with fullness sensation to chest after recent COVID infection. No true chest pain. No reproducible chest pain to palpation.  Anticipate residual irritation/inflammation of lungs post-COVID. Reassurance provided. rec albuterol PRN.  In asthma hx, check peak flow and Rx albuterol PRN. Consider inhaled corticosteroid.  Avoid NSAIDs with recent epigastric discomfort after naprosyn course. Will update CXR post-COVID.   PF today 520 (83% expected) PF expected for age/height = ~630 L/min      Relevant Orders   DG Chest 2 View     No orders of the defined types were placed in this encounter.  Orders Placed This Encounter  Procedures   DG Chest 2 View    Standing Status:   Future    Number of Occurrences:   1    Standing Expiration Date:   05/28/2023    Order Specific Question:   Reason for Exam (SYMPTOM  OR DIAGNOSIS REQUIRED)    Answer:   chest pain and dyspnea post-covid    Order Specific Question:   Preferred imaging location?    Answer:   [facility]     Patient Instructions  Chest xray today Peak flow today  Continue albuterol inhaler as needed.  Anticipate shortness of breath and chest discomfort sensation to improve with time, possibly residual inflammation/irrigation of lungs after COVID.   Follow up plan: Return if symptoms worsen or fail to improve.  Eustaquio Boyden, MD

## 2022-05-27 NOTE — Assessment & Plan Note (Signed)
Notes persistent dyspnea associated with fullness sensation to chest after recent COVID infection. No true chest pain. No reproducible chest pain to palpation.  Anticipate residual irritation/inflammation of lungs post-COVID. Reassurance provided. rec albuterol PRN.  In asthma hx, check peak flow and Rx albuterol PRN. Consider inhaled corticosteroid.  Avoid NSAIDs with recent epigastric discomfort after naprosyn course. Will update CXR post-COVID.   PF today 520 (83% expected) PF expected for age/height = ~630 L/min

## 2022-05-27 NOTE — Assessment & Plan Note (Signed)
Thought costochondritis + sternalis syndrome. This has largely improved with topical and oral NSAID course.

## 2022-08-17 IMAGING — CT CT HEAD W/O CM
1 series · 16 of 30 positions shown, 20 images · non-contrast
Comparison: None.

CLINICAL DATA: Headache, new or worsening sinus pain



[Series 2: head wo · axial · 0.46mm/px · z∈[-125,+15]mm · 16 of 32 slices shown, 20 images]
[im 2/32  brain]
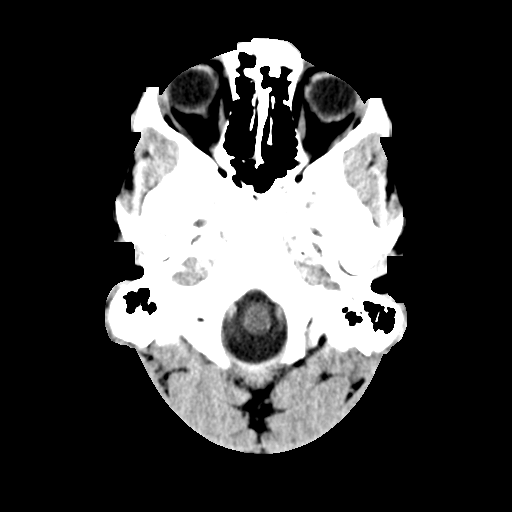
[im 2/32  bone]
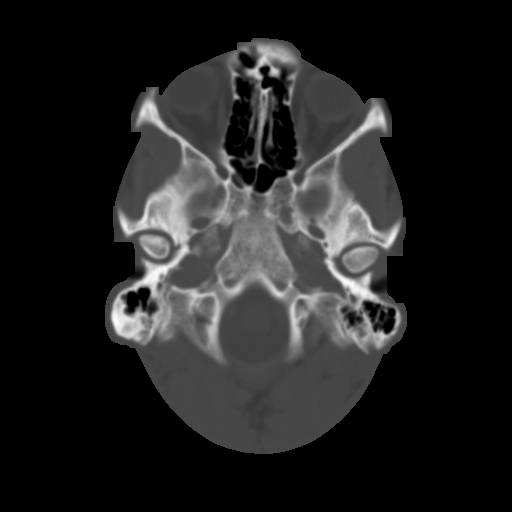
[im 4/32  brain]
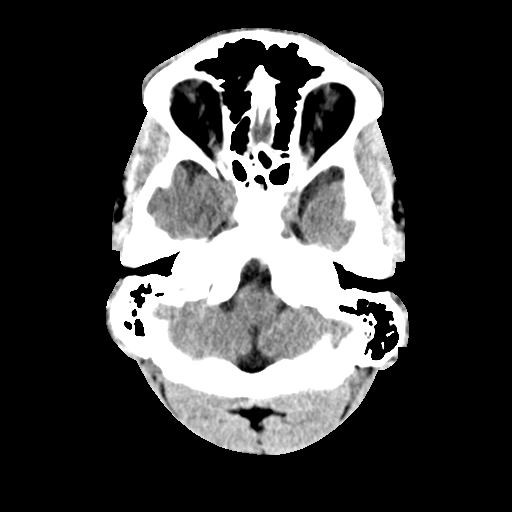
[im 6/32  brain]
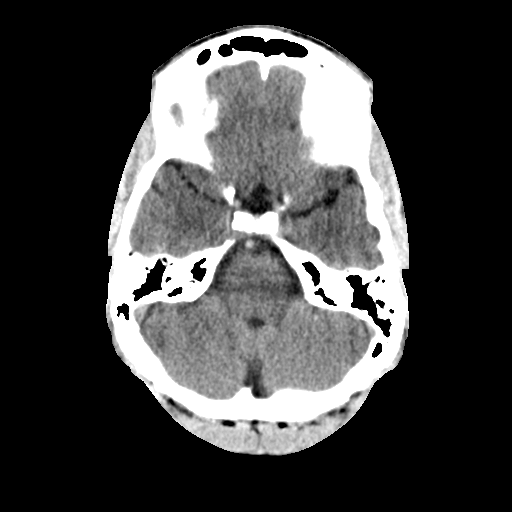
[im 8/32  brain]
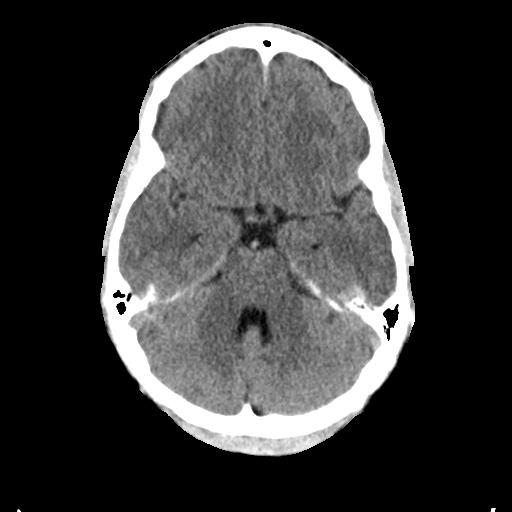
[im 9/32  brain]
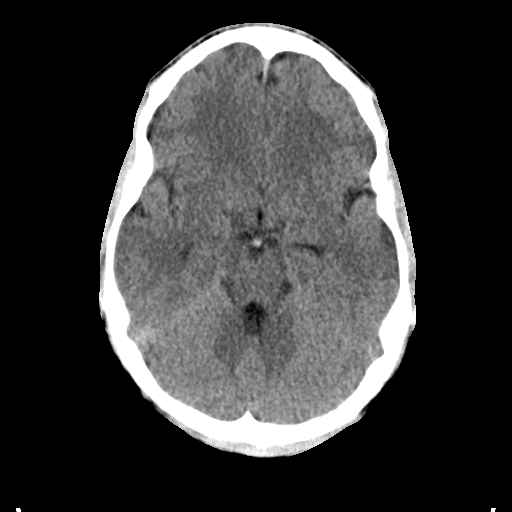
[im 9/32  bone]
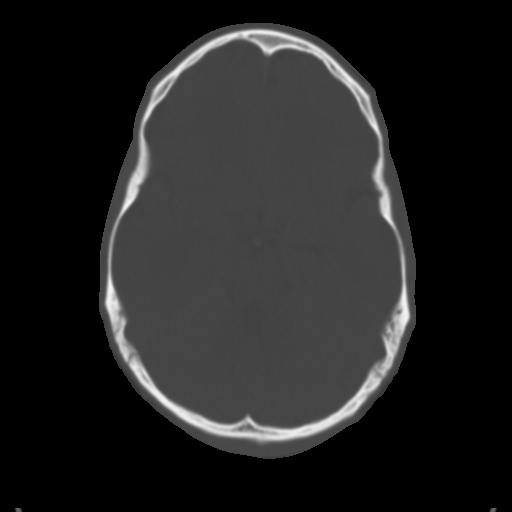
[im 11/32  brain]
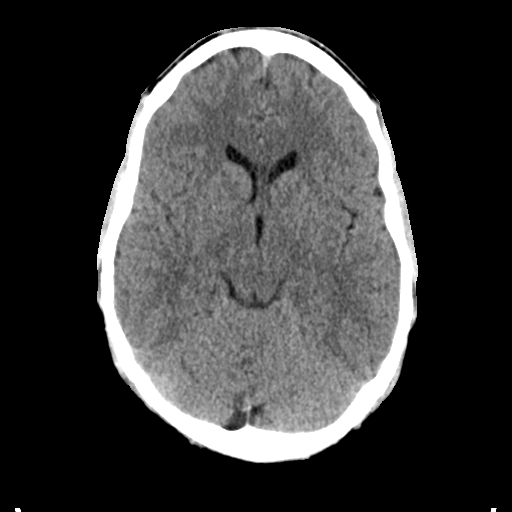
[im 13/32  brain]
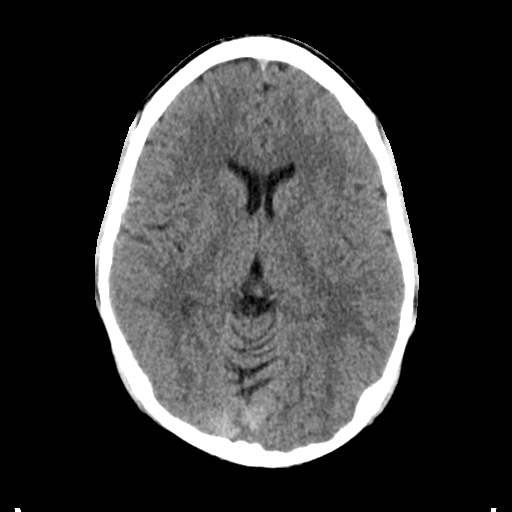
[im 15/32  brain]
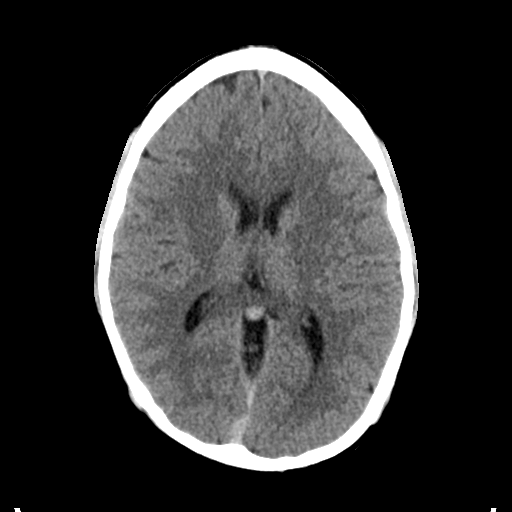
[im 17/32  brain]
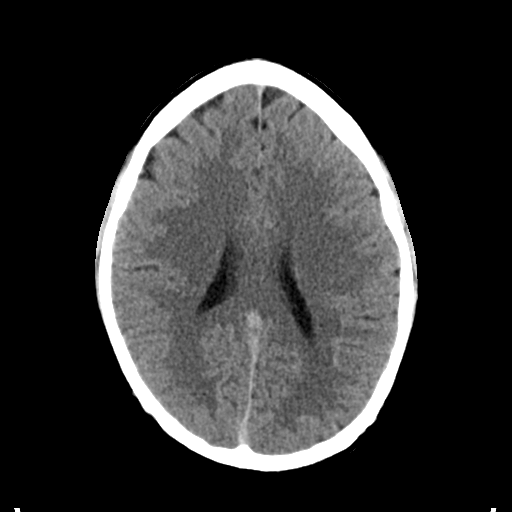
[im 17/32  bone]
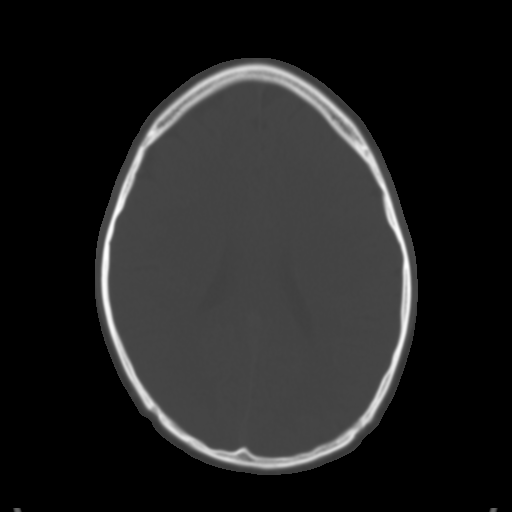
[im 19/32  brain]
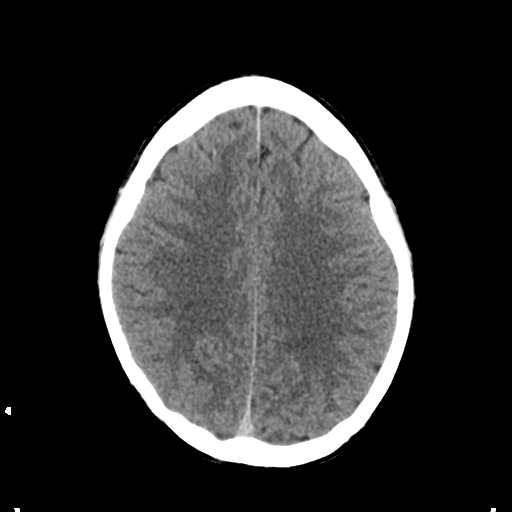
[im 21/32  brain]
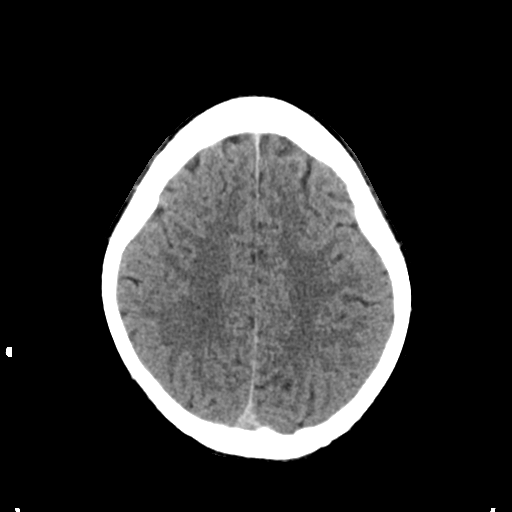
[im 23/32  brain]
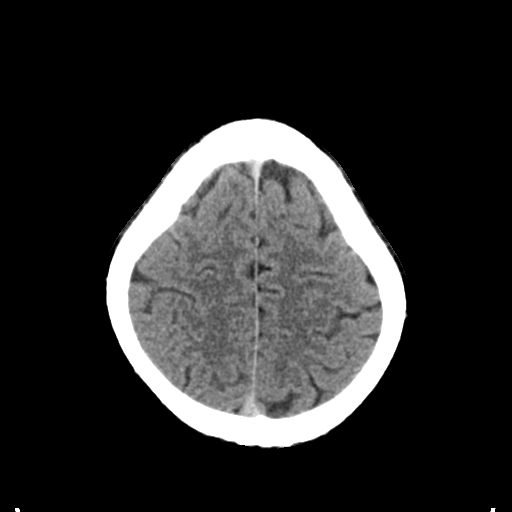
[im 24/32  brain]
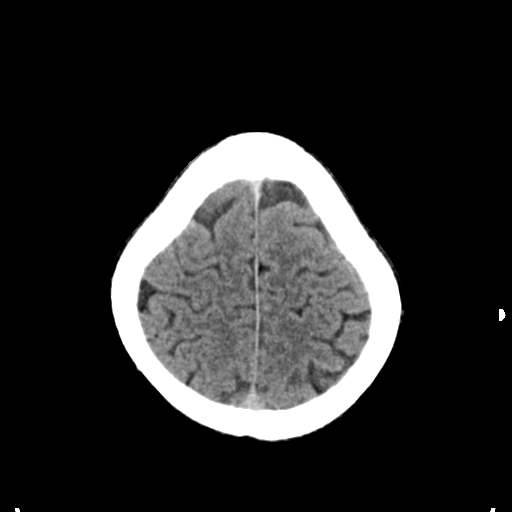
[im 24/32  bone]
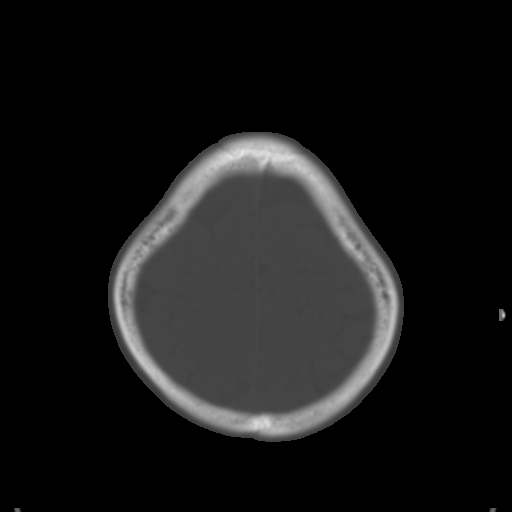
[im 26/32  brain]
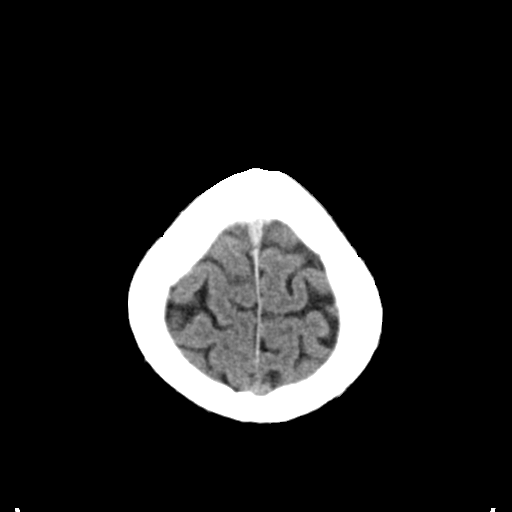
[im 28/32  brain]
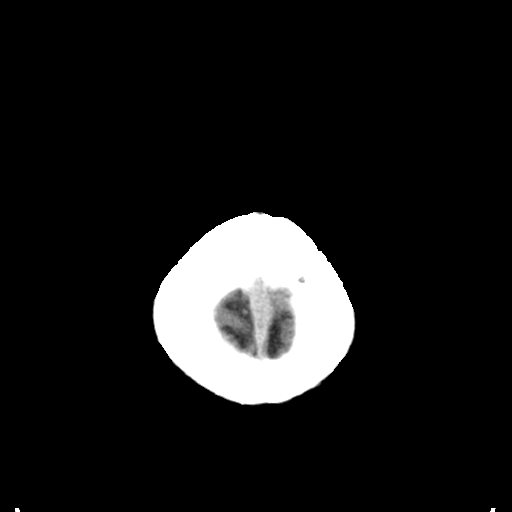
[im 30/32  brain]
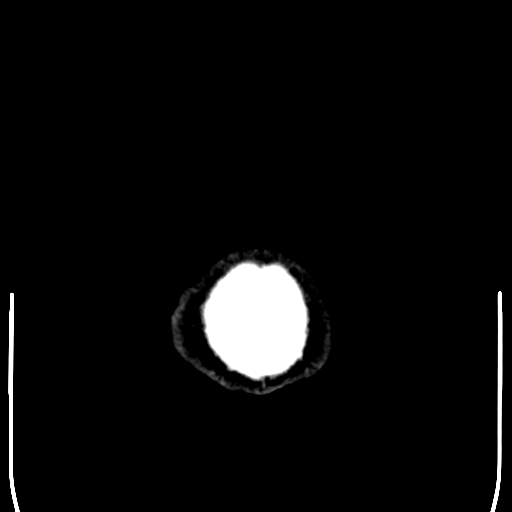

[16 of 30 positions shown; findings below may reference images not displayed]

FINDINGS: Brain: No evidence of acute infarction, hemorrhage, cerebral edema,
mass, mass effect, or midline shift. No hydrocephalus or extra-axial
fluid collection.

Vascular: No hyperdense vessel.

Skull: Normal. Negative for fracture or focal lesion.

Sinuses/Orbits: Imaged sinuses are clear. The orbits are
unremarkable.

Other: The mastoid air cells are well aerated.
IMPRESSION: IMPRESSION
No acute intracranial process. No etiology is seen for the patient's
headache.

## 2022-11-05 ENCOUNTER — Encounter: Payer: Self-pay | Admitting: Family Medicine

## 2022-11-15 ENCOUNTER — Other Ambulatory Visit: Payer: Self-pay | Admitting: Family Medicine

## 2022-11-15 DIAGNOSIS — E7800 Pure hypercholesterolemia, unspecified: Secondary | ICD-10-CM | POA: Insufficient documentation

## 2022-11-15 DIAGNOSIS — E559 Vitamin D deficiency, unspecified: Secondary | ICD-10-CM

## 2022-11-17 ENCOUNTER — Other Ambulatory Visit (INDEPENDENT_AMBULATORY_CARE_PROVIDER_SITE_OTHER): Payer: Federal, State, Local not specified - PPO

## 2022-11-17 ENCOUNTER — Ambulatory Visit: Payer: Federal, State, Local not specified - PPO | Admitting: Family Medicine

## 2022-11-17 DIAGNOSIS — E7800 Pure hypercholesterolemia, unspecified: Secondary | ICD-10-CM | POA: Diagnosis not present

## 2022-11-17 DIAGNOSIS — E559 Vitamin D deficiency, unspecified: Secondary | ICD-10-CM | POA: Diagnosis not present

## 2022-11-17 LAB — COMPREHENSIVE METABOLIC PANEL
ALT: 10 U/L (ref 0–53)
AST: 13 U/L (ref 0–37)
Albumin: 4.4 g/dL (ref 3.5–5.2)
Alkaline Phosphatase: 52 U/L (ref 39–117)
BUN: 14 mg/dL (ref 6–23)
CO2: 25 mEq/L (ref 19–32)
Calcium: 9.2 mg/dL (ref 8.4–10.5)
Chloride: 103 mEq/L (ref 96–112)
Creatinine, Ser: 1.16 mg/dL (ref 0.40–1.50)
GFR: 88.33 mL/min (ref >60.00)
Glucose, Bld: 92 mg/dL (ref 70–99)
Potassium: 4 mEq/L (ref 3.5–5.1)
Sodium: 140 mEq/L (ref 135–145)
Total Bilirubin: 2.2 mg/dL — ABNORMAL HIGH (ref 0.2–1.2)
Total Protein: 7 g/dL (ref 6.0–8.3)

## 2022-11-17 LAB — LIPID PANEL
Cholesterol: 245 mg/dL — ABNORMAL HIGH (ref 0–200)
HDL: 71.1 mg/dL (ref >39.00)
LDL Cholesterol: 161 mg/dL — ABNORMAL HIGH (ref 0–99)
NonHDL: 173.91
Total CHOL/HDL Ratio: 3
Triglycerides: 64 mg/dL (ref 0.0–149.0)
VLDL: 12.8 mg/dL (ref 0.0–40.0)

## 2022-11-17 LAB — VITAMIN D 25 HYDROXY (VIT D DEFICIENCY, FRACTURES): VITD: 19.73 ng/mL — ABNORMAL LOW (ref 30.00–100.00)

## 2022-11-18 ENCOUNTER — Ambulatory Visit: Payer: Federal, State, Local not specified - PPO | Admitting: Family Medicine

## 2022-11-24 ENCOUNTER — Ambulatory Visit (INDEPENDENT_AMBULATORY_CARE_PROVIDER_SITE_OTHER): Payer: Federal, State, Local not specified - PPO | Admitting: Family Medicine

## 2022-11-24 ENCOUNTER — Encounter: Payer: Self-pay | Admitting: Family Medicine

## 2022-11-24 VITALS — BP 114/70 | HR 88 | Temp 97.3°F | Ht 68.25 in | Wt 157.4 lb

## 2022-11-24 DIAGNOSIS — R17 Unspecified jaundice: Secondary | ICD-10-CM | POA: Diagnosis not present

## 2022-11-24 DIAGNOSIS — Z23 Encounter for immunization: Secondary | ICD-10-CM | POA: Diagnosis not present

## 2022-11-24 DIAGNOSIS — E7800 Pure hypercholesterolemia, unspecified: Secondary | ICD-10-CM

## 2022-11-24 DIAGNOSIS — E559 Vitamin D deficiency, unspecified: Secondary | ICD-10-CM

## 2022-11-24 DIAGNOSIS — J302 Other seasonal allergic rhinitis: Secondary | ICD-10-CM

## 2022-11-24 DIAGNOSIS — Z0000 Encounter for general adult medical examination without abnormal findings: Secondary | ICD-10-CM | POA: Diagnosis not present

## 2022-11-24 MED ORDER — FLUTICASONE PROPIONATE 50 MCG/ACT NA SUSP: 2.0000 | Freq: Every day | NASAL | 3 refills | Status: DC

## 2022-11-24 MED ORDER — MONTELUKAST SODIUM 10 MG PO TABS: 10.0000 mg | ORAL_TABLET | Freq: Every day | ORAL | 4 refills | Status: DC

## 2022-11-24 NOTE — Assessment & Plan Note (Signed)
?  gilbert syndrome - discussed. Will monitor.

## 2022-11-24 NOTE — Patient Instructions (Addendum)
Flu shot today  Continue vitamin D3 1000-2000 units daily to replace low vit D levels.  Cholesterol levels remain elevated - work on low cholesterol diet (handout provided).  Bilirubin levels remain mildly elevated - we will watch this. ?Gilbert syndrome.  Good to see you today Return as needed or in 1 year for next physical.

## 2022-11-24 NOTE — Assessment & Plan Note (Deleted)
Chronic s/p tympanostomy 2021 Christopher Pittman) for chronic TM perf

## 2022-11-24 NOTE — Assessment & Plan Note (Addendum)
Chronic. No strong fmhx CAD. Rec diet changes - encouraged low chol diet, handout provided. The ASCVD Risk score (Arnett DK, et al., 2019) failed to calculate for the following reasons:   The 2019 ASCVD risk score is only valid for ages 61 to 46

## 2022-11-24 NOTE — Assessment & Plan Note (Signed)
Rec start vit D 1000-200 IU daily.

## 2022-11-24 NOTE — Assessment & Plan Note (Addendum)
Preventative protocols reviewed and updated unless pt declined. Discussed healthy diet and lifestyle.  Discussed HIV, hep C and RPR next labwork.

## 2022-11-24 NOTE — Assessment & Plan Note (Signed)
Continue singulair and zyrtec daily with PRN flonase.

## 2022-11-24 NOTE — Progress Notes (Signed)
Patient ID: Christopher Pittman, male    DOB: Apr 08, 1996, 27 y.o.   MRN: [id number]  This visit was conducted in person.  BP 114/70   Pulse 88   Temp (!) 97.3 F (36.3 C) (Temporal)   Ht 5' 8.25" (1.734 m)   Wt 157 lb 6 oz (71.4 kg)   SpO2 98%   BMI 23.75 kg/m    CC: CPE Subjective:   HPI: Christopher Pittman is a 27 y.o. male presenting on 11/24/2022 for Annual Exam   Takes zyrtec and singulair regularly, flonase and albuterol rescue inh as needed.  Energy levels are good.   Previously didn't feel well when taking Cholestoff OTC pills (HA, runny nose).  No fmhx CAD, stroke, heart attack.   Preventative: Flu shot today  [place] 02/2020, 03/2020, booster 10/2020 Tdap 07/2021 Seat belt use discussed Sunscreen use discussed. No changing moles on skin.  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 Interim medical history since our last visit reviewed. Allergies and medications reviewed and updated. Outpatient Medications Prior to Visit  Medication Sig Dispense Refill   acetaminophen (TYLENOL) 500 MG tablet Take 1 tablet (500 mg total) by mouth every 12 (twelve) hours. (Patient taking differently: Take 500 mg by mouth every 12 (twelve) hours. As needed) 60 tablet 0   albuterol (VENTOLIN HFA) 108 (90 Base) MCG/ACT inhaler Inhale 2 puffs into the lungs every 6 (six) hours as needed for wheezing or shortness of breath. 8 g 0   cetirizine (ZYRTEC) 10 MG tablet Take 10 mg by mouth daily.     fluticasone (FLONASE) 50 MCG/ACT nasal spray Place 2  sprays into both nostrils daily. 16 g 0   montelukast (SINGULAIR) 10 MG tablet Take 1 tablet (10 mg total) by mouth at bedtime. 30 tablet 11   No facility-administered medications prior to visit.     Per HPI unless specifically indicated in ROS section below Review of Systems  Constitutional:  Negative for activity change, appetite change, chills, fatigue, fever and unexpected weight change.  HENT:  Negative for hearing loss.   Eyes:  Negative for visual disturbance.  Respiratory:  Negative for cough, chest tightness, shortness of breath and wheezing.   Cardiovascular:  Negative for chest pain, palpitations and leg swelling.  Gastrointestinal:  Negative for abdominal distention, abdominal pain, blood in stool, constipation, diarrhea, nausea and vomiting.  Genitourinary:  Negative for difficulty urinating and hematuria.  Musculoskeletal:  Negative for arthralgias, myalgias and neck pain.  Skin:  Negative for rash.  Neurological:  Negative for dizziness, seizures, syncope and headaches.  Hematological:  Negative for adenopathy. Does not bruise/bleed easily.  Psychiatric/Behavioral:  Negative for dysphoric mood. The patient is not nervous/anxious.     Objective:  BP 114/70   Pulse 88   Temp (!) 97.3 F (36.3 C) (Temporal)   Ht 5' 8.25" (1.734 m)   Wt 157 lb 6 oz (71.4 kg)   SpO2 98%   BMI 23.75 kg/m   Wt Readings from Last 3 Encounters:  11/24/22 157 lb 6  oz (71.4 kg)  05/27/22 155 lb 6 oz (70.5 kg)  05/12/22 155 lb (70.3 kg)      Physical Exam Vitals and nursing note reviewed.  Constitutional:      General: He is not in acute distress.    Appearance: Normal appearance. He is well-developed. He is not ill-appearing.  HENT:     Head: Normocephalic and atraumatic.     Right Ear: Hearing, tympanic membrane, ear canal and external ear normal.     Left Ear: Hearing, ear canal and external ear normal. Tympanic membrane is injected and scarred.     Ears:     Comments: Chronic  changes to L TM    Nose: Nose normal.     Mouth/Throat:     Mouth: Mucous membranes are moist.     Pharynx: Oropharynx is clear. No oropharyngeal exudate or posterior oropharyngeal erythema.  Eyes:     General: No scleral icterus.    Extraocular Movements: Extraocular movements intact.     Conjunctiva/sclera: Conjunctivae normal.     Pupils: Pupils are equal, round, and reactive to light.  Neck:     Thyroid: No thyroid mass or thyromegaly.  Cardiovascular:     Rate and Rhythm: Normal rate and regular rhythm.     Pulses: Normal pulses.          Radial pulses are 2+ on the right side and 2+ on the left side.     Heart sounds: Normal heart sounds. No murmur heard. Pulmonary:     Effort: Pulmonary effort is normal. No respiratory distress.     Breath sounds: Normal breath sounds. No wheezing, rhonchi or rales.  Abdominal:     General: Bowel sounds are normal. There is no distension.     Palpations: Abdomen is soft. There is no mass.     Tenderness: There is no abdominal tenderness. There is no guarding or rebound.     Hernia: No hernia is present.  Musculoskeletal:        General: Normal range of motion.     Cervical back: Normal range of motion and neck supple.     Right lower leg: No edema.     Left lower leg: No edema.  Lymphadenopathy:     Cervical: No cervical adenopathy.  Skin:    General: Skin is warm and dry.     Findings: No rash.  Neurological:     General: No focal deficit present.     Mental Status: He is alert and oriented to person, place, and time.  Psychiatric:        Mood and Affect: Mood normal.        Behavior: Behavior normal.        Thought Content: Thought content normal.        Judgment: Judgment normal.       Results for orders placed or performed in visit on 11/17/22  Comprehensive metabolic panel  Result Value Ref Range   Sodium 140 135 - 145 mEq/L   Potassium 4.0 3.5 - 5.1 mEq/L   Chloride 103 96 - 112 mEq/L   CO2 25 19 - 32 mEq/L   Glucose,  Bld 92 70 - 99 mg/dL   BUN 14 6 - 23 mg/dL   Creatinine, Ser 1.16 0.40 - 1.50 mg/dL   Total Bilirubin 2.2 (H) 0.2 - 1.2 mg/dL   Alkaline Phosphatase 52 39 - 117 U/L   AST 13 0 - 37 U/L   ALT 10 0 - 53  U/L   Total Protein 7.0 6.0 - 8.3 g/dL   Albumin 4.4 3.5 - 5.2 g/dL   GFR 88.33 >60.00 mL/min   Calcium 9.2 8.4 - 10.5 mg/dL  Lipid panel  Result Value Ref Range   Cholesterol 245 (H) 0 - 200 mg/dL   Triglycerides 64.0 0.0 - 149.0 mg/dL   HDL 71.10 >39.00 mg/dL   VLDL 12.8 0.0 - 40.0 mg/dL   LDL Cholesterol 161 (H) 0 - 99 mg/dL   Total CHOL/HDL Ratio 3    NonHDL 173.91   VITAMIN D 25 Hydroxy (Vit-D Deficiency, Fractures)  Result Value Ref Range   VITD 19.73 (L) 30.00 - 100.00 ng/mL    Assessment & Plan:   Problem List Items Addressed This Visit     Health maintenance examination - Primary (Chronic)    Preventative protocols reviewed and updated unless pt declined. Discussed healthy diet and lifestyle.  Discussed HIV, hep C and RPR next labwork.       Vitamin D insufficiency    Rec start vit D 1000-200 IU daily.       Seasonal allergic rhinitis    Continue singulair and zyrtec daily with PRN flonase.       Relevant Medications   fluticasone (FLONASE) 50 MCG/ACT nasal spray   Pure hypercholesterolemia    Chronic. No strong fmhx CAD. Rec diet changes - encouraged low chol diet, handout provided. The ASCVD Risk score (Arnett DK, et al., 2019) failed to calculate for the following reasons:   The 2019 ASCVD risk score is only valid for ages 46 to 35       Total bilirubin, elevated    ?gilbert syndrome - discussed. Will monitor.       Other Visit Diagnoses     Need for influenza vaccination       Relevant Orders   Flu Vaccine QUAD 47moIM (Fluarix, Fluzone & Alfiuria Quad PF) (Completed)        Meds ordered this encounter  Medications   fluticasone (FLONASE) 50 MCG/ACT nasal spray    Sig: Place 2 sprays into both nostrils daily.    Dispense:  48 g    Refill:   3   montelukast (SINGULAIR) 10 MG tablet    Sig: Take 1 tablet (10 mg total) by mouth at bedtime.    Dispense:  90 tablet    Refill:  4    Orders Placed This Encounter  Procedures   Flu Vaccine QUAD 610moM (Fluarix, Fluzone & Alfiuria Quad PF)    Patient Instructions  Flu shot today  Continue vitamin D3 1000-2000 units daily to replace low vit D levels.  Cholesterol levels remain elevated - work on low cholesterol diet (handout provided).  Bilirubin levels remain mildly elevated - we will watch this. ?Gilbert syndrome.  Good to see you today Return as needed or in 1 year for next physical.   Follow up plan: Return in about 1 year (around 11/25/2023) for annual exam, prior fasting for blood work.  JaRia BushMD

## 2022-11-30 ENCOUNTER — Telehealth: Payer: Self-pay | Admitting: Family Medicine

## 2022-11-30 NOTE — Telephone Encounter (Addendum)
Left v/m requesting pt to cb to [facility] for additional information and triage. Will also my chart pt as well. Sending note to [place] triage, Dr Darnell Level and G pool and will teams [place] CMA back.

## 2022-11-30 NOTE — Telephone Encounter (Signed)
Someone started a new note on phone note dated 11/30/22. Please see that note as well.

## 2022-11-30 NOTE — Telephone Encounter (Signed)
Plz triage pt.  °

## 2022-11-30 NOTE — Telephone Encounter (Signed)
[place] Day - Client TELEPHONE ADVICE RECORD [facility] Patient Name: Christopher Pittman Gender: Male DOB: September 02, 1996 Age: 27 Y 3 M 7 D Return Phone Number: [phone] (Primary) Address: City/ State/ Zip: [place] [address] Client [place] [facility] - Client Client Site [place] Provider Ria Bush - MD Contact Type Call Who Is Calling Patient / Member / Family / Caregiver Call Type Triage / Clinical Relationship To Patient Self Return Phone Number [phone] (Primary) Chief Complaint CHEST PAIN - pain, pressure, heaviness or tightness Reason for Call Symptomatic / Request for [place] states he is having chest pain. Translation No Nurse Assessment Nurse: Lavina Hamman, RN, Thomasena Edis Date/Time (Eastern Time): 11/30/2022 12:33:40 PM Confirm and document reason for call. If symptomatic, describe symptoms. ---Caller states having mid-sternal chest pressure that started on Tuesday. Does the patient have any new or worsening symptoms? ---Yes Will a triage be completed? ---Yes Related visit to physician within the last 2 weeks? ---No Does the PT have any chronic conditions? (i.e. diabetes, asthma, this includes High risk factors for pregnancy, etc.) ---No Is this a behavioral health or substance abuse call? ---No Guidelines Guideline Title Affirmed Question Affirmed Notes Nurse Date/Time (Eastern Time) Chest Pain [1] Chest pain lasts > 5 minutes AND [2] described as crushing, pressure-like, or heavy Lavina Hamman, RN, Thomasena Edis 11/30/2022 12:33:58 PM Disp. Time Eilene Ghazi Time) Disposition Final User 11/30/2022 12:32:07 PM Send to Urgent Elease Hashimoto 11/30/2022 12:37:39 PM Call EMS 911 Now Yes Lavina Hamman, RN, Thomasena Edis 11/30/2022 12:43:44 PM 911 Outcome Documentation Lavina Hamman, RN, Thomasena Edis Reason: refused. PLEASE NOTE: All timestamps contained within this report are represented  as [place] Standard Time. CONFIDENTIALTY NOTICE: This fax transmission is intended only for the addressee. It contains information that is legally privileged, confidential or otherwise protected from use or disclosure. If you are not the intended recipient, you are strictly prohibited from reviewing, disclosing, copying using or disseminating any of this information or taking any action in reliance on or regarding this information. If you have received this fax in error, please notify us immediately by telephone so that we can arrange for its return to [place]. Phone: [phone], Toll-Free: [phone], Fax: [phone] Page: 2 of 2 Call Id: [id number] Final Disposition 11/30/2022 12:37:39 PM Call EMS 911 Now Yes Lavina Hamman, RN, Juline Patch Disagree/Comply Comply Caller Understands Yes PreDisposition InappropriateToAsk Care Advice Given Per Guideline * Immediate medical attention is needed. You need to hang up and call 911 (or an ambulance). CALL EMS 911 NOW: CARE ADVICE given per Chest Pain (Adult) guideline. Comments User: Farrel Gobble, RN Date/Time Eilene Ghazi Time): 11/30/2022 12:43:26 PM attempted to call backline to notify of pt's refusal, but no answer. Told pt to call back to the office

## 2022-11-30 NOTE — Telephone Encounter (Signed)
Patient called and said he had spoken to a triage nurse because he was having slight pain in chest and pain in arm. Patient said he talked to the triage nurse and they said they were going to send [place] some inofrmation and that they wanted him to get a DVT and an EKG done. Please advise. Call back is [phone]

## 2022-11-30 NOTE — Telephone Encounter (Signed)
I spoke with pt; pt said starting on 11/24/22 after pt had a flu shot he developed pain lt arm. Pt said after flu shot had dull pain on and off in lt arm No redness or swelling or pain in lt arm now. Pt said no CP, pressure or tightness now. I spoke with Dr Darnell Level and he said if pt is symptom free he can try Tylenol if pain comes back and then cb for appt. I advised pt of Dr Darnell Level instructions and pt said he does have lt arm pain; I explained to pt that I just asked him that and he said he did not have arm pain. Pt said it is more lt shoulder pain;no CP or pressure or tightness. Pt said pain level of lt shoulder is 4. I asked pt if he had taken tylenol and pt said yes and it did no good to help pain. Pt scheduled appt with Dr Darnell Level on 12/01/22 at 12 noon with UC & ED instructions and pt voiced understanding. Sending note to Dr Darnell Level and I spoke with [facility] CMA.

## 2022-12-01 ENCOUNTER — Encounter: Payer: Self-pay | Admitting: Family Medicine

## 2022-12-01 ENCOUNTER — Ambulatory Visit: Payer: Federal, State, Local not specified - PPO | Admitting: Family Medicine

## 2022-12-01 VITALS — BP 122/78 | HR 87 | Temp 97.3°F | Ht 68.25 in | Wt 158.4 lb

## 2022-12-01 DIAGNOSIS — M25512 Pain in left shoulder: Secondary | ICD-10-CM | POA: Diagnosis not present

## 2022-12-01 MED ORDER — VITAMIN D3 50 MCG (2000 UT) PO CAPS: 2000.0000 [IU] | ORAL_CAPSULE | Freq: Every day | ORAL | Status: AC

## 2022-12-01 NOTE — Assessment & Plan Note (Signed)
Acute left shoulder pain that started after flu shot - also may have started after he started vit D3 2000 IU supplementation.  Overall reassuring exam - not consistent with cardiac or pulmonary cause.  Anticipate side effect after flu shot vs side effect to vit D supplement.  Rec ibuprofen 437m BID x 3 days with meals.  Rec heating pad to tender areas. Rec hold vit D and retry once feeling better.  Update if not improving with treatment.

## 2022-12-01 NOTE — Patient Instructions (Signed)
Exam overall reassuringly normal Likely side effect to flu shot causing some joint inflammation and discomfort at shoulder. Take ibuprofen 454m twice daily with meals for 3 days.  Heating pad to tender areas - covered in a towel - several times a day can help. Gentle stretching of shoulder and chest wall muscles.  This should help resolve discomfort/pain. Let [place] if ongoing or worsening.

## 2022-12-01 NOTE — Progress Notes (Addendum)
Patient ID: Christopher Pittman, male    DOB: August 28, 1996, 27 y.o.   MRN: [id number]  This visit was conducted in person.  BP 122/78   Pulse 87   Temp (!) 97.3 F (36.3 C) (Temporal)   Ht 5' 8.25" (1.734 m)   Wt 158 lb 6 oz (71.8 kg)   SpO2 97%   BMI 23.90 kg/m    CC: L shoulder pain  Subjective:   HPI: Christopher Pittman is a 27 y.o. male presenting on 12/01/2022 for Shoulder Pain (C/o L shoulder pain. Started 11/24/22 after flu shot. )   Seen last week for physical, at that time received flu shot to left deltoid. Subsequently that evening developed left shoulder discomfort above site of shot with radiation down arm, without skin changes. Also has had some chest discomfort midline into epigastric region. This has been present on and off. Tried tylenol and pepcid without significant benefit. Over the past week symptoms are about the same. Some tingling to L arm yesterday, but no electrical shock or numbness.   No GERD symptoms.  No change in diet.  No new exercise routine.  No nausea, dizziness, diaphoresis, diarrhea or constipation, fevers/chills.      Relevant past medical, surgical, family and social history reviewed and updated as indicated. Interim medical history since our last visit reviewed. Allergies and medications reviewed and updated. Outpatient Medications Prior to Visit  Medication Sig Dispense Refill   acetaminophen (TYLENOL) 500 MG tablet Take 1 tablet (500 mg total) by mouth every 12 (twelve) hours. (Patient taking differently: Take 500 mg by mouth every 12 (twelve) hours. As needed) 60 tablet 0   albuterol (VENTOLIN HFA) 108 (90 Base) MCG/ACT inhaler Inhale 2 puffs into the lungs every 6 (six) hours as needed for wheezing or shortness of breath. 8 g 0   cetirizine (ZYRTEC) 10 MG tablet Take 10 mg by mouth daily.     fluticasone (FLONASE) 50 MCG/ACT nasal spray Place 2 sprays into both nostrils daily. 48 g 3   montelukast (SINGULAIR) 10 MG tablet Take 1 tablet (10 mg  total) by mouth at bedtime. 90 tablet 4   No facility-administered medications prior to visit.     Per HPI unless specifically indicated in ROS section below Review of Systems  Objective:  BP 122/78   Pulse 87   Temp (!) 97.3 F (36.3 C) (Temporal)   Ht 5' 8.25" (1.734 m)   Wt 158 lb 6 oz (71.8 kg)   SpO2 97%   BMI 23.90 kg/m   Wt Readings from Last 3 Encounters:  12/01/22 158 lb 6 oz (71.8 kg)  11/24/22 157 lb 6 oz (71.4 kg)  05/27/22 155 lb 6 oz (70.5 kg)      Physical Exam Vitals and nursing note reviewed.  Constitutional:      Appearance: Normal appearance. He is not ill-appearing.  HENT:     Head: Normocephalic and atraumatic.  Cardiovascular:     Rate and Rhythm: Normal rate and regular rhythm.     Pulses: Normal pulses.     Heart sounds: Normal heart sounds. No murmur heard. Pulmonary:     Effort: Pulmonary effort is normal. No respiratory distress.     Breath sounds: Normal breath sounds. No wheezing, rhonchi or rales.  Chest:     Chest wall: Tenderness (mild midline upper sternal discomfort) present.  Abdominal:     General: Bowel sounds are normal.     Palpations: Abdomen is soft.  Tenderness: There is no abdominal tenderness.  Musculoskeletal:        General: No swelling, tenderness or deformity. Normal range of motion.     Comments:  Bilateral shoulder exam: No deformity of shoulders on inspection. No pain with palpation of shoulder landmarks. FROM in abduction and forward flexion. No pain or weakness with testing SITS in ext/int rotation. No pain with empty can sign. Neg Speed test. No impingement. No pain with crossover test. No pain with rotation of humeral head in Sanders joint.   Skin:    General: Skin is warm and dry.     Findings: No rash.  Neurological:     General: No focal deficit present.     Mental Status: He is alert.     Sensory: Sensation is intact.     Motor: Motor function is intact.     Coordination: Coordination is intact.      Comments:  5/5 strength BUE, grip strength intact Sensation intact BUE to light touch and temperature   Psychiatric:        Mood and Affect: Mood normal.        Behavior: Behavior normal.       Results for orders placed or performed in visit on 11/17/22  Comprehensive metabolic panel  Result Value Ref Range   Sodium 140 135 - 145 mEq/L   Potassium 4.0 3.5 - 5.1 mEq/L   Chloride 103 96 - 112 mEq/L   CO2 25 19 - 32 mEq/L   Glucose, Bld 92 70 - 99 mg/dL   BUN 14 6 - 23 mg/dL   Creatinine, Ser 1.16 0.40 - 1.50 mg/dL   Total Bilirubin 2.2 (H) 0.2 - 1.2 mg/dL   Alkaline Phosphatase 52 39 - 117 U/L   AST 13 0 - 37 U/L   ALT 10 0 - 53 U/L   Total Protein 7.0 6.0 - 8.3 g/dL   Albumin 4.4 3.5 - 5.2 g/dL   GFR 88.33 >60.00 mL/min   Calcium 9.2 8.4 - 10.5 mg/dL  Lipid panel  Result Value Ref Range   Cholesterol 245 (H) 0 - 200 mg/dL   Triglycerides 64.0 0.0 - 149.0 mg/dL   HDL 71.10 >39.00 mg/dL   VLDL 12.8 0.0 - 40.0 mg/dL   LDL Cholesterol 161 (H) 0 - 99 mg/dL   Total CHOL/HDL Ratio 3    NonHDL 173.91   VITAMIN D 25 Hydroxy (Vit-D Deficiency, Fractures)  Result Value Ref Range   VITD 19.73 (L) 30.00 - 100.00 ng/mL    Assessment & Plan:   Problem List Items Addressed This Visit     Acute pain of left shoulder - Primary    Acute left shoulder pain that started after flu shot - also may have started after he started vit D3 2000 IU supplementation.  Overall reassuring exam - not consistent with cardiac or pulmonary cause.  Anticipate side effect after flu shot vs side effect to vit D supplement.  Rec ibuprofen 460m BID x 3 days with meals.  Rec heating pad to tender areas. Rec hold vit D and retry once feeling better.  Update if not improving with treatment.         Meds ordered this encounter  Medications   Cholecalciferol (VITAMIN D3) 50 MCG (2000 UT) CAPS    Sig: Take 1 capsule (2,000 Units total) by mouth daily.    No orders of the defined types were placed  in this encounter.   Patient Instructions  Exam overall reassuringly  normal Likely side effect to flu shot causing some joint inflammation and discomfort at shoulder. Take ibuprofen 466m twice daily with meals for 3 days.  Heating pad to tender areas - covered in a towel - several times a day can help. Gentle stretching of shoulder and chest wall muscles.  This should help resolve discomfort/pain. Let [place] if ongoing or worsening.   Follow up plan: No follow-ups on file.  JRia Bush MD

## 2023-01-13 ENCOUNTER — Encounter: Payer: Self-pay | Admitting: Internal Medicine

## 2023-01-13 ENCOUNTER — Other Ambulatory Visit: Payer: Self-pay

## 2023-01-13 ENCOUNTER — Ambulatory Visit: Payer: Federal, State, Local not specified - PPO | Admitting: Internal Medicine

## 2023-01-13 VITALS — BP 110/70 | HR 71 | Temp 98.2°F | Resp 20 | Ht 65.5 in | Wt 154.2 lb

## 2023-01-13 DIAGNOSIS — J302 Other seasonal allergic rhinitis: Secondary | ICD-10-CM | POA: Diagnosis not present

## 2023-01-13 DIAGNOSIS — J3089 Other allergic rhinitis: Secondary | ICD-10-CM

## 2023-01-13 DIAGNOSIS — J45990 Exercise induced bronchospasm: Secondary | ICD-10-CM

## 2023-01-13 DIAGNOSIS — K219 Gastro-esophageal reflux disease without esophagitis: Secondary | ICD-10-CM

## 2023-01-13 DIAGNOSIS — J4520 Mild intermittent asthma, uncomplicated: Secondary | ICD-10-CM | POA: Diagnosis not present

## 2023-01-13 MED ORDER — FAMOTIDINE 20 MG PO TABS: 20.0000 mg | ORAL_TABLET | Freq: Every day | ORAL | 5 refills | Status: DC | PRN

## 2023-01-13 MED ORDER — FLUTICASONE PROPIONATE 50 MCG/ACT NA SUSP: 2.0000 | Freq: Every day | NASAL | 5 refills | Status: AC

## 2023-01-13 MED ORDER — OLOPATADINE HCL 0.2 % OP SOLN: 1.0000 [drp] | Freq: Every day | OPHTHALMIC | 5 refills | Status: AC | PRN

## 2023-01-13 MED ORDER — CETIRIZINE HCL 10 MG PO TABS: 10.0000 mg | ORAL_TABLET | Freq: Every day | ORAL | 5 refills | Status: AC

## 2023-01-13 MED ORDER — AZELASTINE HCL 0.1 % NA SOLN: 1.0000 | Freq: Two times a day (BID) | NASAL | 5 refills | Status: DC | PRN

## 2023-01-13 MED ORDER — ALBUTEROL SULFATE HFA 108 (90 BASE) MCG/ACT IN AERS: 2.0000 | INHALATION_SPRAY | Freq: Four times a day (QID) | RESPIRATORY_TRACT | 1 refills | Status: AC | PRN

## 2023-01-13 NOTE — Progress Notes (Signed)
NEW PATIENT  Date of Service/Encounter:  01/13/23  Consult requested by: Ria Bush, MD   Subjective:   Christopher Pittman (DOB: August 04, 1996) is a 27 y.o. male who presents to the clinic on 01/13/2023 with a chief complaint of Establish Care .    History obtained from: chart review and patient.   Asthma/EIB:  Diagnosed at age 66.  He reports symptoms only with significant activity and rarely SOB now unless he overexerts himself.   rarely daytime symptoms in past month, never nighttime awakenings in past month Using rescue inhaler rarely; last use was 3-4 months ago.  Limitations to daily activity: none 0 ED visits/UC visits and 0 oral steroids in the past year 0 number of lifetime hospitalizations, 0 number of lifetime intubations.  Identified Triggers: exercise Prior PFTs or spirometry:  none for review Current regimen:  Maintenance: none Rescue: Albuterol 2 puffs q4-6 hrs PRN  Rhinitis:  Started in childhood.  Symptoms include:  itchy skin, nasal congestion, rhinorrhea, post nasal drainage, sneezing, watery eyes, and itchy eyes  Occurs seasonally-Spring/Fall Potential triggers: pollen Treatments tried:  Flonase PRN  Zyrtec daily; last use was Thursday over 3 days ago Singulair; felt worse symptoms with rhinorrhea so he stopped it   Previous allergy testing: yes; 2015 with pollen History of sinus surgery: no Nonallergic triggers: none   GERD Previously was having some chest discomfort and burning which resolved with PRN Pepcid. He no longer requires this.   Concern for Food Allergy Reports undergoing testing and was positive to shellfish in the past but he eats shrimp and crab without issues and wants to eat lobster.  He has never had any reactions to shellfish.  He also wanted to update his food allergy testing; denies avoiding any other foods except lobster. Denies any reactions to foods.    Past Medical History: Past Medical History:  Diagnosis Date    Costochondritis 08/14/2021   Exercise-induced asthma    in childhood   Open fracture of left tibia and fibula, type I or II, initial encounter 07/14/2021   Seasonal allergies    Tympanic membrane perforation, left 03/26/2020   Unintentional weight loss 08/14/2021   Vitamin D insufficiency 07/15/2021   Past Surgical History: Past Surgical History:  Procedure Laterality Date   ADENOIDECTOMY     ESOPHAGOGASTRODUODENOSCOPY (EGD) WITH PROPOFOL N/A 10/01/2021   WNL, biopsies WNL (Vanga, Tally Due, MD)   I & D EXTREMITY Left 07/14/2021   Procedure: IRRIGATION AND DEBRIDEMENT EXTREMITY;  Surgeon: Altamese Alamo, MD;  Location: [place] [facility];  Service: Orthopedics;  Laterality: Left;   MYRINGOTOMY     TIBIA IM NAIL INSERTION Left 07/14/2021   Procedure: INTRAMEDULLARY (IM) NAIL TIBIAL;  Surgeon: Altamese Woodworth, MD;  Location: [facility];  Service: Orthopedics;  Laterality: Left;   TYMPANOPLASTY Left 04/08/2020   Procedure: LEFT TYMPANOPLASTY;  Surgeon: Izora Gala, MD;  Location: [facility];  Service: ENT;  Laterality: Left;    Family History: Family History  Problem Relation Age of Onset   Asthma Brother     Social History:  Lives in a 16 year house Flooring in bedroom: carpet Pets: dog Tobacco use/exposure: none Job: Dealer   Medication List:  Allergies as of 01/13/2023   No Known Allergies      Medication List        Accurate as of January 13, 2023 12:20 PM. If you have any questions, ask your nurse or doctor.          Acetaminophen  Extra Strength 500 MG Tabs Take 1 tablet (500 mg total) by mouth every 12 (twelve) hours. What changed: additional instructions   albuterol 108 (90 Base) MCG/ACT inhaler Commonly known as: VENTOLIN HFA Inhale 2 puffs into the lungs every 6 (six) hours as needed for wheezing or shortness of breath.   azelastine 0.1 % nasal spray Commonly known as: ASTELIN Place 1 spray into both nostrils 2 (two) times daily as needed  for rhinitis or allergies. Use in each nostril as directed Started by: Larose Kells, MD   cetirizine 10 MG tablet Commonly known as: ZYRTEC Take 1 tablet (10 mg total) by mouth daily.   famotidine 20 MG tablet Commonly known as: Pepcid Take 1 tablet (20 mg total) by mouth daily as needed for heartburn or indigestion. Started by: Larose Kells, MD   fluticasone 50 MCG/ACT nasal spray Commonly known as: FLONASE Place 2 sprays into both nostrils daily.   montelukast 10 MG tablet Commonly known as: SINGULAIR Take 1 tablet (10 mg total) by mouth at bedtime.   Olopatadine HCl 0.2 % Soln Apply 1 drop to eye daily as needed (itchy watery eyes). Started by: Larose Kells, MD   Vitamin D3 50 MCG (2000 UT) Caps Generic drug: Cholecalciferol Take 1 capsule (2,000 Units total) by mouth daily.         REVIEW OF SYSTEMS: Pertinent positives and negatives discussed in HPI.   Objective:   Physical Exam: BP 110/70 (BP Location: Left Arm, Patient Position: Sitting, Cuff Size: Normal)   Pulse 71   Temp 98.2 F (36.8 C) (Temporal)   Resp 20   Ht 5' 5.5" (1.664 m)   Wt 154 lb 3.2 oz (69.9 kg)   SpO2 100%   BMI 25.27 kg/m  Body mass index is 25.27 kg/m. GEN: alert, well developed HEENT: clear conjunctiva, TM grey and translucent, nose with + inferior turbinate hypertrophy, pink nasal mucosa, slight clear rhinorrhea, + cobblestoning HEART: regular rate and rhythm, no murmur LUNGS: clear to auscultation bilaterally, no coughing, unlabored respiration ABDOMEN: soft, non distended  SKIN: no rashes or lesions  Reviewed:  11/24/2022: seen by Dr. Danise Mina PCP for seasonal allergic rhinitis on Singulair, Zyrtec and PRN Flonase. Also has PRN albuterol for exercise asthma.   03/17/2022: seen by ENT; history of tubes and left sided tympanoplasty with Dr. Constance Holster in June 202. Having intermittent L temporal and jaw pain, use ibuprofen PRN.   12/22/2021: seen in urgent care [facility] [place]/ED  for 2 weeks of headaches, congestion, PND. Thought to be sinusitis, started on Augmentin and Prednisone.  Spirometry:  Tracings reviewed. His effort: It was hard to get consistent efforts and there is a question as to whether this reflects a maximal maneuver. FVC: 3.58L FEV1: 3.34L, 90% predicted FEV1/FVC ratio: 93% Interpretation: Spirometry consistent with normal pattern.  Please see scanned spirometry results for details.  Skin Testing:  Skin prick testing was placed, which includes aeroallergens/foods, histamine control, and saline control.  Verbal consent was obtained prior to placing test.  Patient tolerated procedure well.  Allergy testing results were read and interpreted by myself, documented by clinical staff. Adequate positive and negative control.  Results discussed with patient/family.  Airborne Adult Perc - 01/13/23 1103     Time Antigen Placed 1030    Allergen Manufacturer Greer    Location Back    Number of Test 59    1. Control-Buffer 50% Glycerol Negative    2. Control-Histamine 1 mg/ml 3+  3. Albumin saline Negative    4. [place] 2+    5. [place] Negative    6. Johnson 3+    7. [place] Blue 2+    8. Meadow Fescue Negative    9. Perennial Rye Negative    10. Sweet Vernal Negative    11. Timothy Negative    12. Cocklebur Negative    13. Burweed Marshelder 2+    14. Ragweed, short 2+    15. Ragweed, Giant Negative    16. Plantain,  English Negative    17. Lamb's Quarters 2+    18. Sheep Sorrell 2+    19. Rough Pigweed 2+    20. Marsh Elder, Rough 2+    21. Mugwort, Common Negative    22. Ash mix Negative    23. Birch mix 3+    24. Beech American 3+    25. Box, Elder Negative    26. Cedar, red Negative    27. Cottonwood, [place] Negative    28. Elm mix 3+    29. Hickory Negative    30. Maple mix Negative    31. Oak, [place] mix 3+    32. Pecan Pollen Negative    33. Pine mix 3+    34. Sycamore Eastern 3+    35. [place], Black Pollen 3+    36.  Alternaria alternata 3+    37. Cladosporium Herbarum 3+    38. Aspergillus mix 2+    39. Penicillium mix Negative    40. Bipolaris sorokiniana (Helminthosporium) Negative    41. Drechslera spicifera (Curvularia) Negative    42. Mucor plumbeus Negative    43. Fusarium moniliforme Negative    44. Aureobasidium pullulans (pullulara) Negative    45. Rhizopus oryzae Negative    46. Botrytis cinera Negative    47. Epicoccum nigrum Negative    48. Phoma betae Negative    49. Candida Albicans Negative    50. Trichophyton mentagrophytes Negative    51. Mite, D Farinae  5,000 AU/ml 3+    52. Mite, D Pteronyssinus  5,000 AU/ml 3+    53. Cat Hair 10,000 BAU/ml 3+    54.  Dog Epithelia Negative    55. Mixed Feathers Negative    56. Horse Epithelia Negative    57. Cockroach, German Negative    58. Mouse Negative    59. Tobacco Leaf Negative    Other Omitted    Other Omitted             Food Adult Perc - 01/13/23 1100     Time Antigen Placed 1030    Allergen Manufacturer Greer    Location Back    Number of allergen test 1    8. Shellfish Mix Negative               Assessment:   1. Mild intermittent asthma without complication   2. Exercise induced bronchospasm   3. Seasonal and perennial allergic rhinitis   4. Gastroesophageal reflux disease without esophagitis     Plan/Recommendations:  Mild Intermittent Asthma Exercise Induced Bronchospasm - Normal spirometry, well controlled.  - Maintenance inhaler: none - Rescue inhaler: Albuterol 2 puffs via spacer or 1 vial via nebulizer every 4-6 hours as needed for respiratory symptoms of cough, shortness of breath, or wheezing Asthma control goals:  Full participation in all desired activities (may need albuterol before activity) Albuterol use two times or less a week on average (not counting use with activity) Cough interfering with sleep two  times or less a month Oral steroids no more than once a year No  hospitalizations  Allergic Rhinitis: - Due to turbinate hypertrophy, seasonal symptoms and unresponsive to OTC meds, performed skin testing to identify aeroallergen triggers.   - Positive skin test 01/2023: trees, grasses, weeds, mold, cat, dust mite - Avoidance measures discussed. - Use nasal saline rinses before nose sprays such as with Neilmed Sinus Rinse.  Use distilled water.   - Use Flonase 2 sprays each nostril daily. Aim upward and outward. - Use Azelastine 1-2 sprays each nostril twice daily as needed for runny nose, drainage, sneezing, congestion. Aim upward and outward. - Use Zyrtec 10 mg daily.  Okay to stay off Singulair since it did not help.  - For eyes, use Olopatadine or Ketotifen 1 eye drop daily as needed for itchy, watery eyes.  Available over the counter, if not covered by insurance.  - Consider allergy shots as long term control of your symptoms by teaching your immune system to be more tolerant of your allergy triggers  Concern for Food Allergy - Skin testing was negative to shellfish. You are okay to eat shellfish.  - Discussed low utility in blanket food testing due to high false positive rates; therefore, do not recommend blanket food testing and since he is worried about one specific food, we did do shellfish testing even though he has never had a reaction to it.   GERD - Use Pepcid 20mg  daily as needed for reflux.  -Avoid lying down for at least two hours after a meal or after drinking acidic beverages, like soda, or other caffeinated beverages. This can help to prevent stomach contents from flowing back into the esophagus. -Keep your head elevated while you sleep. Using an extra pillow or two can also help to prevent reflux. -Eat smaller and more frequent meals each day instead of a few large meals. This promotes digestion and can aid in preventing heartburn. -Wear loose-fitting clothes to ease pressure on the stomach, which can worsen heartburn and reflux. -Reduce  excess weight around the midsection. This can ease pressure on the stomach. Such pressure can force some stomach contents back up the esophagus.   Return in about 6 weeks (around 02/24/2023).  Harlon Flor, MD [facility]

## 2023-01-13 NOTE — Patient Instructions (Addendum)
Carolyne Fiscal  Mild Intermittent Asthma Exercise Induced Bronchospasm - Normal spirometry, well controlled.  - Maintenance inhaler: none - Rescue inhaler: Albuterol 2 puffs via spacer or 1 vial via nebulizer every 4-6 hours as needed for respiratory symptoms of cough, shortness of breath, or wheezing Asthma control goals:  Full participation in all desired activities (may need albuterol before activity) Albuterol use two times or less a week on average (not counting use with activity) Cough interfering with sleep two times or less a month Oral steroids no more than once a year No hospitalizations  Allergic Rhinitis:  - Positive skin test 01/2023: trees, grasses, weeds, mold, cat, dust mite - Avoidance measures discussed. - Use nasal saline rinses before nose sprays such as with Neilmed Sinus Rinse.  Use distilled water.   - Use Flonase 2 sprays each nostril daily. Aim upward and outward. - Use Azelastine 1-2 sprays each nostril twice daily as needed for runny nose, drainage, sneezing, congestion. Aim upward and outward. - Use Zyrtec 10 mg daily.  - For eyes, use Olopatadine or Ketotifen 1 eye drop daily as needed for itchy, watery eyes.  Available over the counter, if not covered by insurance.  - Consider allergy shots as long term control of your symptoms by teaching your immune system to be more tolerant of your allergy triggers  Concern for Food Allergy - Skin testing was negative to shellfish. You are okay to eat shellfish.   GERD - Use Pepcid 20mg  daily as needed for reflux.  -Avoid lying down for at least two hours after a meal or after drinking acidic beverages, like soda, or other caffeinated beverages. This can help to prevent stomach contents from flowing back into the esophagus. -Keep your head elevated while you sleep. Using an extra pillow or two can also help to prevent reflux. -Eat smaller and more frequent meals each day instead of a few large meals. This promotes  digestion and can aid in preventing heartburn. -Wear loose-fitting clothes to ease pressure on the stomach, which can worsen heartburn and reflux. -Reduce excess weight around the midsection. This can ease pressure on the stomach. Such pressure can force some stomach contents back up the esophagus.    ALLERGEN AVOIDANCE MEASURES   Dust Mites Use central air conditioning and heat; and change the filter monthly.  Pleated filters work better than mesh filters.  Electrostatic filters may also be used; wash the filter monthly.  Window air conditioners may be used, but do not clean the air as well as a central air conditioner.  Change or wash the filter monthly. Keep windows closed.  Do not use attic fans.   Encase the mattress, box springs and pillows with zippered, dust proof covers. Wash the bed linens in hot water weekly.   Remove carpet, especially from the bedroom. Remove stuffed animals, throw pillows, dust ruffles, heavy drapes and other items that collect dust from the bedroom. Do not use a humidifier.   Use wood, vinyl or leather furniture instead of cloth furniture in the bedroom. Keep the indoor humidity at 30 - 40%.  Monitor with a humidity gauge.  Molds - Indoor avoidance Use air conditioning to reduce indoor humidity.  Do not use a humidifier. Keep indoor humidity at 30 - 40%.  Use a dehumidifier if needed. In the bathroom use an exhaust fan or open a window after showering.  Wipe down damp surfaces after showering.  Clean bathrooms with a mold-killing solution (diluted bleach, or products like Tilex, etc)  at least once a month. In the kitchen use an exhaust fan to remove steam from cooking.  Throw away spoiled foods immediately, and empty garbage daily.  Empty water pans below self-defrosting refrigerators frequently. Vent the clothes dryer to the outside. Limit indoor houseplants; mold grows in the dirt.  No houseplants in the bedroom. Remove carpet from the bedroom. Encase the  mattress and box springs with a zippered encasing.  Molds - Outdoor avoidance Avoid being outside when the grass is being mowed, or the ground is tilled. Avoid playing in leaves, pine straw, hay, etc.  Dead plant materials contain mold. Avoid going into barns or grain storage areas. Remove leaves, clippings and compost from around the home.  Cockroach Limit spread of food around the house; especially keep food out of bedrooms. Keep food and garbage in closed containers with a tight lid.  Never leave food out in the kitchen.  Do not leave out pet food or dirty food bowls. Mop the kitchen floor and wash countertops at least once a week. Repair leaky pipes and faucets so there is no standing water to attract roaches. Plug up cracks in the house through which cockroaches can enter. Use bait stations and approved pesticides to reduce cockroach infestation. Pollen Avoidance Pollen levels are highest during the mid-day and afternoon.  Consider this when planning outdoor activities. Avoid being outside when the grass is being mowed, or wear a mask if the pollen-allergic person must be the one to mow the grass. Keep the windows closed to keep pollen outside of the home. Use an air conditioner to filter the air. Take a shower, wash hair, and change clothing after working or playing outdoors during pollen season. Pet Dander Keep the pet out of your bedroom and restrict it to only a few rooms. Be advised that keeping the pet in only one room will not limit the allergens to that room. Don't pet, hug or kiss the pet; if you do, wash your hands with soap and water. High-efficiency particulate air (HEPA) cleaners run continuously in a bedroom or living room can reduce allergen levels over time. Regular use of a high-efficiency vacuum cleaner or a central vacuum can reduce allergen levels. Giving your pet a bath at least once a week can reduce airborne allergen.

## 2023-01-25 ENCOUNTER — Other Ambulatory Visit: Payer: Federal, State, Local not specified - PPO

## 2023-02-01 ENCOUNTER — Encounter: Payer: Federal, State, Local not specified - PPO | Admitting: Family Medicine

## 2023-03-03 ENCOUNTER — Ambulatory Visit: Payer: Federal, State, Local not specified - PPO | Admitting: Internal Medicine

## 2023-07-14 ENCOUNTER — Ambulatory Visit: Payer: Federal, State, Local not specified - PPO | Admitting: Internal Medicine

## 2023-07-14 ENCOUNTER — Encounter: Payer: Self-pay | Admitting: Family Medicine

## 2023-07-14 VITALS — BP 102/60 | HR 76 | Temp 98.3°F | Ht 65.5 in | Wt 151.0 lb

## 2023-07-14 DIAGNOSIS — R1013 Epigastric pain: Secondary | ICD-10-CM

## 2023-07-14 MED ORDER — OMEPRAZOLE 20 MG PO CPDR: 20.0000 mg | DELAYED_RELEASE_CAPSULE | Freq: Every day | ORAL | 1 refills | Status: DC

## 2023-07-14 NOTE — Assessment & Plan Note (Signed)
Did have EGD and negative H pylori in 2022 Seems like gastritis Will have him start omeprazole 20mg  daily  If symptoms resolve quickly (like within days) can try to wean off slowly after 2 weeks. If takes longer, should take for 1-2 months before trying to wean

## 2023-07-14 NOTE — Progress Notes (Signed)
Subjective:    Patient ID: Christopher Pittman, male    DOB: May 09, 1996, 27 y.o.   MRN: [id number]  HPI Here due to recurrence of stomach trouble  Having epigastric discomfort---just below ribcage Happened after surgery on leg about a year ago (hospitalized for 2 days) Had used pepcid--but stopped in April Now having pain again Now daily again for past week Discomfort comes and goes----often has it upon awakening No change after eating  No chest or throat burning No N/V Bowels are fine---no blood  Current Outpatient Medications on File Prior to Visit  Medication Sig Dispense Refill   acetaminophen (TYLENOL) 500 MG tablet Take 1 tablet (500 mg total) by mouth every 12 (twelve) hours. (Patient taking differently: Take 500 mg by mouth every 12 (twelve) hours. As needed) 60 tablet 0   albuterol (VENTOLIN HFA) 108 (90 Base) MCG/ACT inhaler Inhale 2 puffs into the lungs every 6 (six) hours as needed for wheezing or shortness of breath. 8 g 1   azelastine (ASTELIN) 0.1 % nasal spray Place 1 spray into both nostrils 2 (two) times daily as needed for rhinitis or allergies. Use in each nostril as directed 30 mL 5   cetirizine (ZYRTEC) 10 MG tablet Take 1 tablet (10 mg total) by mouth daily. 30 tablet 5   Cholecalciferol (VITAMIN D3) 50 MCG (2000 UT) CAPS Take 1 capsule (2,000 Units total) by mouth daily.     fluticasone (FLONASE) 50 MCG/ACT nasal spray Place 2 sprays into both nostrils daily. 16 g 5   montelukast (SINGULAIR) 10 MG tablet Take 1 tablet (10 mg total) by mouth at bedtime. 90 tablet 4   Olopatadine HCl 0.2 % SOLN Apply 1 drop to eye daily as needed (itchy watery eyes). 2.5 mL 5   famotidine (PEPCID) 20 MG tablet Take 1 tablet (20 mg total) by mouth daily as needed for heartburn or indigestion. (Patient not taking: Reported on 07/14/2023) 30 tablet 5   No current facility-administered medications on file prior to visit.    No Known Allergies  Past Medical History:  Diagnosis Date    Costochondritis 08/14/2021   Exercise-induced asthma    in childhood   Open fracture of left tibia and fibula, type I or II, initial encounter 07/14/2021   Seasonal allergies    Tympanic membrane perforation, left 03/26/2020   Unintentional weight loss 08/14/2021   Vitamin D insufficiency 07/15/2021    Past Surgical History:  Procedure Laterality Date   ADENOIDECTOMY     ESOPHAGOGASTRODUODENOSCOPY (EGD) WITH PROPOFOL N/A 10/01/2021   WNL, biopsies WNL (Vanga, Loel Dubonnet, MD)   I & D EXTREMITY Left 07/14/2021   Procedure: IRRIGATION AND DEBRIDEMENT EXTREMITY;  Surgeon: Myrene Galas, MD;  Location: [facility] OR;  Service: Orthopedics;  Laterality: Left;   MYRINGOTOMY     TIBIA IM NAIL INSERTION Left 07/14/2021   Procedure: INTRAMEDULLARY (IM) NAIL TIBIAL;  Surgeon: Myrene Galas, MD;  Location: [facility] OR;  Service: Orthopedics;  Laterality: Left;   TYMPANOPLASTY Left 04/08/2020   Procedure: LEFT TYMPANOPLASTY;  Surgeon: [name], MD;  Location: [facility];  Service: ENT;  Laterality: Left;    Family History  Problem Relation Age of Onset   Asthma Brother     Social History   Socioeconomic History   Marital status: Single    Spouse name: Not on file   Number of children: Not on file   Years of education: Not on file   Highest education level: Associate degree: academic program  Occupational  History   Not on file  Tobacco Use   Smoking status: Never   Smokeless tobacco: Never  Vaping Use   Vaping status: Never Used  Substance and Sexual Activity   Alcohol use: Never   Drug use: Never   Sexual activity: Not on file  Other Topics Concern   Not on file  Social History Narrative   Lives with mom and dad, 1 dog   Edu: Primary school teacher - Therapist, occupational   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    International aid/development worker of Corporate investment banker Strain: Low Risk  (07/14/2023)   Overall Financial Resource Strain (CARDIA)    Difficulty  of Paying Living Expenses: Not hard at all  Food Insecurity: No Food Insecurity (07/14/2023)   Hunger Vital Sign    Worried About Running Out of Food in the Last Year: Never true    Ran Out of Food in the Last Year: Never true  Transportation Needs: No Transportation Needs (07/14/2023)   PRAPARE - Administrator, Civil Service (Medical): No    Lack of Transportation (Non-Medical): No  Physical Activity: Sufficiently Active (07/14/2023)   Exercise Vital Sign    Days of Exercise per Week: 3 days    Minutes of Exercise per Session: 150+ min  Stress: No Stress Concern Present (07/14/2023)   [employer] of Occupational Health - Occupational Stress Questionnaire    Feeling of Stress : Not at all  Social Connections: Socially Integrated (07/14/2023)   Social Connection and Isolation Panel [NHANES]    Frequency of Communication with Friends and Family: More than three times a week    Frequency of Social Gatherings with Friends and Family: More than three times a week    Attends Religious Services: More than 4 times per year    Active Member of [employer] or Organizations: Yes    Attends Engineer, structural: More than 4 times per year    Marital Status: Living with partner  Intimate Partner Violence: Not on file   Review of Systems Appetite is fine Weight is stable No urinary problems No regular anxiety or depression No fever and not sick No tobacco Only occasional beer (2-3 times a month)    Objective:   Physical Exam Constitutional:      Appearance: Normal appearance.  Cardiovascular:     Rate and Rhythm: Normal rate and regular rhythm.     Heart sounds: No murmur heard.    No gallop.  Pulmonary:     Effort: Pulmonary effort is normal.     Breath sounds: Normal breath sounds. No wheezing or rales.  Abdominal:     General: Bowel sounds are normal. There is no distension.     Palpations: Abdomen is soft.     Tenderness: There is no abdominal tenderness. There  is no guarding or rebound.  Musculoskeletal:     Cervical back: Neck supple.     Right lower leg: No edema.     Left lower leg: No edema.  Lymphadenopathy:     Cervical: No cervical adenopathy.  Neurological:     Mental Status: He is alert.            Assessment & Plan:

## 2023-07-15 ENCOUNTER — Encounter: Payer: Self-pay | Admitting: Internal Medicine

## 2023-07-15 DIAGNOSIS — R079 Chest pain, unspecified: Secondary | ICD-10-CM

## 2023-07-16 ENCOUNTER — Ambulatory Visit: Payer: Federal, State, Local not specified - PPO | Admitting: Family Medicine

## 2023-07-19 ENCOUNTER — Ambulatory Visit: Payer: Federal, State, Local not specified - PPO | Admitting: Internal Medicine

## 2023-07-19 ENCOUNTER — Encounter: Payer: Self-pay | Admitting: Internal Medicine

## 2023-07-19 VITALS — BP 102/70 | HR 68 | Temp 98.6°F | Ht 65.5 in | Wt 149.0 lb

## 2023-07-19 DIAGNOSIS — R1013 Epigastric pain: Secondary | ICD-10-CM | POA: Diagnosis not present

## 2023-07-19 NOTE — Assessment & Plan Note (Signed)
Same symptoms as last week Daily omeprazole on an empty stomach hasn't helped No clear reflux--though feels the pain is in his chest at times No illness or suggestion of cardiopulmonary problems Will ask Dr Allegra Lai to see him again

## 2023-07-19 NOTE — Telephone Encounter (Signed)
I spoke with pt; pt said pain in chest and SOB no  worse than when  seen on 07/14/23; pt wants testing to verify not a blood clot. Pt does not want to go to ED. No available appts at [facility] today and pt scheduled appt with Dr Alphonsus Sias on 07/20/23 at 8 am with UC & ED precautions. Pt said  no leg redness, swelling or pain.pt said CP and SOB comes and goes. Sending note to Dr Epimenio Foot pool and FYI to Dr Reece Agar as  PCP.

## 2023-07-19 NOTE — Telephone Encounter (Signed)
Karie Schwalbe, MD  to Me     07/19/23  9:42 AM  Definitely needs ED if any recurrence of CP or SOB   Per DPR left detailed v/m of Dr Alphonsus Sias note above. When I spoke with pt gave ED precautions and pt voiced understanding.sending note as FYI to Dr Alphonsus Sias.

## 2023-07-19 NOTE — Progress Notes (Signed)
Subjective:    Patient ID: Christopher Pittman, male    DOB: 1996/04/07, 27 y.o.   MRN: [id number]  HPI Here due to ongoing symptoms  Still the same discomfort--epigastric area Notices it in the morning --before eating Doesn't get worse after eating  Some symptoms up in chest now No fever No cough Doesn't feel like an infection Gets SOB with the discomfort at times  Appetite is good Weight stable No problems with exercise--though not doing as much lately  No change with the omeprazole  No supplements other than his vitamin  Current Outpatient Medications on File Prior to Visit  Medication Sig Dispense Refill   acetaminophen (TYLENOL) 500 MG tablet Take 1 tablet (500 mg total) by mouth every 12 (twelve) hours. (Patient taking differently: Take 500 mg by mouth every 12 (twelve) hours. As needed) 60 tablet 0   albuterol (VENTOLIN HFA) 108 (90 Base) MCG/ACT inhaler Inhale 2 puffs into the lungs every 6 (six) hours as needed for wheezing or shortness of breath. 8 g 1   azelastine (ASTELIN) 0.1 % nasal spray Place 1 spray into both nostrils 2 (two) times daily as needed for rhinitis or allergies. Use in each nostril as directed 30 mL 5   cetirizine (ZYRTEC) 10 MG tablet Take 1 tablet (10 mg total) by mouth daily. 30 tablet 5   Cholecalciferol (VITAMIN D3) 50 MCG (2000 UT) CAPS Take 1 capsule (2,000 Units total) by mouth daily.     fluticasone (FLONASE) 50 MCG/ACT nasal spray Place 2 sprays into both nostrils daily. 16 g 5   montelukast (SINGULAIR) 10 MG tablet Take 1 tablet (10 mg total) by mouth at bedtime. 90 tablet 4   Olopatadine HCl 0.2 % SOLN Apply 1 drop to eye daily as needed (itchy watery eyes). 2.5 mL 5   omeprazole (PRILOSEC) 20 MG capsule Take 1 capsule (20 mg total) by mouth daily. 90 capsule 1   No current facility-administered medications on file prior to visit.    No Known Allergies  Past Medical History:  Diagnosis Date   Costochondritis 08/14/2021    Exercise-induced asthma    in childhood   Open fracture of left tibia and fibula, type I or II, initial encounter 07/14/2021   Seasonal allergies    Tympanic membrane perforation, left 03/26/2020   Unintentional weight loss 08/14/2021   Vitamin D insufficiency 07/15/2021    Past Surgical History:  Procedure Laterality Date   ADENOIDECTOMY     ESOPHAGOGASTRODUODENOSCOPY (EGD) WITH PROPOFOL N/A 10/01/2021   WNL, biopsies WNL (Vanga, Loel Dubonnet, MD)   I & D EXTREMITY Left 07/14/2021   Procedure: IRRIGATION AND DEBRIDEMENT EXTREMITY;  Surgeon: Myrene Galas, MD;  Location: [facility];  Service: Orthopedics;  Laterality: Left;   MYRINGOTOMY     TIBIA IM NAIL INSERTION Left 07/14/2021   Procedure: INTRAMEDULLARY (IM) NAIL TIBIAL;  Surgeon: Myrene Galas, MD;  Location: [facility];  Service: Orthopedics;  Laterality: Left;   TYMPANOPLASTY Left 04/08/2020   Procedure: LEFT TYMPANOPLASTY;  Surgeon: [name], MD;  Location: [facility];  Service: ENT;  Laterality: Left;    Family History  Problem Relation Age of Onset   Asthma Brother     Social History   Socioeconomic History   Marital status: Single    Spouse name: Not on file   Number of children: Not on file   Years of education: Not on file   Highest education level: Associate degree: academic program  Occupational History   Not  on file  Tobacco Use   Smoking status: Never   Smokeless tobacco: Never  Vaping Use   Vaping status: Never Used  Substance and Sexual Activity   Alcohol use: Never   Drug use: Never   Sexual activity: Not on file  Other Topics Concern   Not on file  Social History Narrative   Lives with mom and dad, 1 dog   Edu: Primary school teacher - Therapist, occupational   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    International aid/development worker of Corporate investment banker Strain: Low Risk  (07/14/2023)   Overall Financial Resource Strain (CARDIA)    Difficulty of Paying Living Expenses: Not  hard at all  Food Insecurity: No Food Insecurity (07/14/2023)   Hunger Vital Sign    Worried About Running Out of Food in the Last Year: Never true    Ran Out of Food in the Last Year: Never true  Transportation Needs: No Transportation Needs (07/14/2023)   PRAPARE - Administrator, Civil Service (Medical): No    Lack of Transportation (Non-Medical): No  Physical Activity: Sufficiently Active (07/14/2023)   Exercise Vital Sign    Days of Exercise per Week: 3 days    Minutes of Exercise per Session: 150+ min  Stress: No Stress Concern Present (07/14/2023)   [employer] of Occupational Health - Occupational Stress Questionnaire    Feeling of Stress : Not at all  Social Connections: Socially Integrated (07/14/2023)   Social Connection and Isolation Panel [NHANES]    Frequency of Communication with Friends and Family: More than three times a week    Frequency of Social Gatherings with Friends and Family: More than three times a week    Attends Religious Services: More than 4 times per year    Active Member of [employer] or Organizations: Yes    Attends Engineer, structural: More than 4 times per year    Marital Status: Living with partner  Intimate Partner Violence: Not on file   Review of Systems Bowels move okay No blood in stool No nausea vomiting     Objective:   Physical Exam Constitutional:      Appearance: Normal appearance.  Cardiovascular:     Rate and Rhythm: Normal rate and regular rhythm.     Heart sounds: No murmur heard.    No gallop.  Pulmonary:     Effort: Pulmonary effort is normal.     Breath sounds: Normal breath sounds. No wheezing or rales.  Abdominal:     Palpations: Abdomen is soft.     Tenderness: There is no abdominal tenderness. There is no guarding or rebound.  Musculoskeletal:     Cervical back: Neck supple.     Right lower leg: No edema.     Left lower leg: No edema.  Lymphadenopathy:     Cervical: No cervical adenopathy.   Neurological:     Mental Status: He is alert.            Assessment & Plan:

## 2023-07-20 ENCOUNTER — Ambulatory Visit: Payer: Federal, State, Local not specified - PPO | Admitting: Internal Medicine

## 2023-07-20 LAB — COMPREHENSIVE METABOLIC PANEL
ALT: 10 U/L (ref 0–53)
AST: 13 U/L (ref 0–37)
Albumin: 4.4 g/dL (ref 3.5–5.2)
Alkaline Phosphatase: 55 U/L (ref 39–117)
BUN: 13 mg/dL (ref 6–23)
CO2: 30 meq/L (ref 19–32)
Calcium: 9.6 mg/dL (ref 8.4–10.5)
Chloride: 102 meq/L (ref 96–112)
Creatinine, Ser: 1.14 mg/dL (ref 0.40–1.50)
GFR: 89.77 mL/min (ref >60.00)
Glucose, Bld: 96 mg/dL (ref 70–99)
Potassium: 4.4 meq/L (ref 3.5–5.1)
Sodium: 137 meq/L (ref 135–145)
Total Bilirubin: 2.6 mg/dL — ABNORMAL HIGH (ref 0.2–1.2)
Total Protein: 7.3 g/dL (ref 6.0–8.3)

## 2023-07-20 LAB — SEDIMENTATION RATE: Sed Rate: 3 mm/h (ref 0–15)

## 2023-07-20 LAB — CBC
HCT: 45.5 % (ref 39.0–52.0)
Hemoglobin: 15 g/dL (ref 13.0–17.0)
MCHC: 33.1 g/dL (ref 30.0–36.0)
MCV: 91.4 fL (ref 78.0–100.0)
Platelets: 201 10*3/uL (ref 150.0–400.0)
RBC: 4.98 Mil/uL (ref 4.22–5.81)
RDW: 11.8 % (ref 11.5–15.5)
WBC: 3.2 10*3/uL — ABNORMAL LOW (ref 4.0–10.5)

## 2023-07-21 ENCOUNTER — Ambulatory Visit: Payer: Federal, State, Local not specified - PPO | Admitting: Family Medicine

## 2023-07-21 NOTE — Telephone Encounter (Signed)
I put in order for d-dimer  Have him come in today if possible --or certainly by tomorrow

## 2023-07-21 NOTE — Telephone Encounter (Addendum)
Pt walked in for lab appt. Advised to put him on the schedule. He was put on tomorrow's lab schedule.

## 2023-07-22 ENCOUNTER — Other Ambulatory Visit (INDEPENDENT_AMBULATORY_CARE_PROVIDER_SITE_OTHER): Payer: Federal, State, Local not specified - PPO

## 2023-07-22 DIAGNOSIS — R079 Chest pain, unspecified: Secondary | ICD-10-CM

## 2023-07-23 LAB — D-DIMER, QUANTITATIVE: D-Dimer, Quant: <0.19 ug{FEU}/mL (ref <0.50)

## 2023-08-23 ENCOUNTER — Encounter: Payer: Self-pay | Admitting: Family Medicine

## 2023-08-26 ENCOUNTER — Ambulatory Visit: Payer: Federal, State, Local not specified - PPO | Admitting: Physician Assistant

## 2023-10-13 DIAGNOSIS — D414 Neoplasm of uncertain behavior of bladder: Secondary | ICD-10-CM

## 2023-10-13 HISTORY — DX: Neoplasm of uncertain behavior of bladder: D41.4

## 2023-12-20 ENCOUNTER — Encounter: Payer: Self-pay | Admitting: Family Medicine

## 2024-01-06 ENCOUNTER — Telehealth: Payer: Self-pay | Admitting: Family Medicine

## 2024-01-06 NOTE — Telephone Encounter (Unsigned)
 Copied from CRM [id number]. Topic: General - Call Back - No Documentation >> Jan 06, 2024  2:42 PM Shereese L wrote: Reason for [id number] needs a call back in order to get authorization to get tints on his car due to the sun irritating his eyes

## 2024-01-11 ENCOUNTER — Encounter: Payer: Self-pay | Admitting: Family Medicine

## 2024-01-12 NOTE — Telephone Encounter (Signed)
 Copied from CRM [id number]. Topic: General - Other >> Jan 11, 2024  5:13 PM Denese Killings wrote: Reason for CRM: Patient is calling to follow up on messages he sent to Dr. Sharen Hones since 12/20/23. Please call patient back as soon as possible.

## 2024-01-18 NOTE — Telephone Encounter (Signed)
 LVM for patient to cb and schedule wife appt with Dr. Reece Agar.

## 2024-01-18 NOTE — Telephone Encounter (Signed)
 Patient wife scheduled, Amy will add it on.

## 2024-01-18 NOTE — Telephone Encounter (Signed)
 I previously agreed to see pt's wife - can we offer to schedule appt?

## 2024-01-26 ENCOUNTER — Encounter: Payer: Self-pay | Admitting: Internal Medicine

## 2024-02-04 NOTE — Telephone Encounter (Signed)
Replied via other message.  

## 2024-02-15 ENCOUNTER — Ambulatory Visit: Admitting: Family Medicine

## 2024-02-15 VITALS — BP 116/84 | HR 87 | Temp 98.1°F | Ht 65.5 in | Wt 157.2 lb

## 2024-02-15 DIAGNOSIS — K30 Functional dyspepsia: Secondary | ICD-10-CM | POA: Diagnosis not present

## 2024-02-15 DIAGNOSIS — S6010XA Contusion of unspecified finger with damage to nail, initial encounter: Secondary | ICD-10-CM | POA: Diagnosis not present

## 2024-02-15 DIAGNOSIS — J302 Other seasonal allergic rhinitis: Secondary | ICD-10-CM

## 2024-02-15 DIAGNOSIS — R0789 Other chest pain: Secondary | ICD-10-CM

## 2024-02-15 DIAGNOSIS — E559 Vitamin D deficiency, unspecified: Secondary | ICD-10-CM

## 2024-02-15 DIAGNOSIS — R6889 Other general symptoms and signs: Secondary | ICD-10-CM | POA: Insufficient documentation

## 2024-02-15 NOTE — Assessment & Plan Note (Signed)
 Of unclear cause. Not consistent with migraine. Vision screen normal today  Recommend schedule eye exam as no recent evaluation.  Recommend regular sunglass use when in bright light.  No current diagnosis to qualify for window tint permit.

## 2024-02-15 NOTE — Assessment & Plan Note (Signed)
 Discussed allergy  regimen

## 2024-02-15 NOTE — Patient Instructions (Addendum)
 Subungual hematoma should improve over time.  For eye sensitivity - schedule eye exam, use quality sunglasses regularly when out in the sun.  Vision screen today.  Chest wall pain suspicious for costochondritis again - use ice/heating pad covered in towel, topical voltaren to affected area, may take over the counter ibuprofen  200mg  2 tablets with meals for 3-5 days.  Return for fasting labs and physical in 1-3 months.

## 2024-02-15 NOTE — Assessment & Plan Note (Signed)
 Rec PRN omeprazole  20mg  use.

## 2024-02-15 NOTE — Assessment & Plan Note (Addendum)
 Continue vitamin D  2000 units daily.  Update levels when he returns for physical.

## 2024-02-15 NOTE — Assessment & Plan Note (Addendum)
 Mild, nailbed unaffected. Discussed anticipated course of resolution.

## 2024-02-15 NOTE — Assessment & Plan Note (Signed)
 Story/exam consistent with MSK cause - costochondritis. Supportive measures reviewed - rec gentle chest wall stretching, ice/heat, topical voltaren, and OTC ibuprofen .  Update if ongoing or worsening, consider PT referral.

## 2024-02-15 NOTE — Progress Notes (Signed)
 Ph: [phone] Fax: [phone]   Patient ID: Christopher Pittman, male    DOB: 1996/04/25, 28 y.o.   MRN: [id number]  This visit was conducted in person.  BP 116/84   Pulse 87   Temp 98.1 F (36.7 C) (Oral)   Ht 5' 5.5" (1.664 m)   Wt 157 lb 4 oz (71.3 kg)   SpO2 94%   BMI 25.77 kg/m   Vision Screening   Right eye Left eye Both eyes  Without correction 20/20 20/20 20/20   With correction        CC: R finger injury Subjective:   HPI: Christopher Pittman is a 28 y.o. male presenting on 02/15/2024 for Finger Injury (Pt slammed R middle finger in car door about 3 wks ago. Tip of finger discolored. Wants to make sure no blood clot. )   R handed  DOI: 3 weeks ago  Jammed car door onto R middle finger. Fingernail bruised below.  Took tylenol  for pain control with benefit.   Also wants to discuss tinted window permit through [facility] for light sensitivity.  No h/o migraines.  Due for eye doctor evaluation  He does endorse headaches - dull pain to bilateral temples associated with bright sunlight. No nausea/vomiting, significant phonophobia, not activity limiting.  Other day had to pull over due to headache while driving.  Treats with tylenol  with benefit.  No vision changes, double vision, facial numbness, unilateral weakness, confusion, slurred speech. No fevers/chills, sinus congestion.   Some allergic rhinitis - currently taking singulair , flonase , zyrtec , astelin , and olopatadine  eye drops.   5 days ago developed chest discomfort again - initially on left side, then on right side - described as dull pain. No burning. No significant GERD symptoms. He tried pepcid  and prilosec without much benefit.   No fevers/chills, fatigue, new joint pains, redness/swelling of joints, new rashes, swollen glands, redness or discomfort to eyes.      Relevant past medical, surgical, family and social history reviewed and updated as indicated. Interim medical history since our last visit  reviewed. Allergies and medications reviewed and updated. Outpatient Medications Prior to Visit  Medication Sig Dispense Refill   acetaminophen  (TYLENOL ) 500 MG tablet Take 1 tablet (500 mg total) by mouth every 12 (twelve) hours. (Patient taking differently: Take 500 mg by mouth every 12 (twelve) hours. As needed) 60 tablet 0   albuterol  (VENTOLIN  HFA) 108 (90 Base) MCG/ACT inhaler Inhale 2 puffs into the lungs every 6 (six) hours as needed for wheezing or shortness of breath. 8 g 1   cetirizine  (ZYRTEC ) 10 MG tablet Take 1 tablet (10 mg total) by mouth daily. 30 tablet 5   Cholecalciferol  (VITAMIN D3) 50 MCG (2000 UT) CAPS Take 1 capsule (2,000 Units total) by mouth daily.     fluticasone  (FLONASE ) 50 MCG/ACT nasal spray Place 2 sprays into both nostrils daily. 16 g 5   montelukast  (SINGULAIR ) 10 MG tablet Take 1 tablet (10 mg total) by mouth at bedtime. 90 tablet 4   Olopatadine  HCl 0.2 % SOLN Apply 1 drop to eye daily as needed (itchy watery eyes). 2.5 mL 5   omeprazole  (PRILOSEC) 20 MG capsule Take 1 capsule (20 mg total) by mouth daily. 90 capsule 1   azelastine  (ASTELIN ) 0.1 % nasal spray Place 1 spray into both nostrils 2 (two) times daily as needed for rhinitis or allergies. Use in each nostril as directed 30 mL 5   No facility-administered medications prior to visit.     Per  HPI unless specifically indicated in ROS section below Review of Systems  Objective:  BP 116/84   Pulse 87   Temp 98.1 F (36.7 C) (Oral)   Ht 5' 5.5" (1.664 m)   Wt 157 lb 4 oz (71.3 kg)   SpO2 94%   BMI 25.77 kg/m   Wt Readings from Last 3 Encounters:  02/15/24 157 lb 4 oz (71.3 kg)  07/19/23 149 lb (67.6 kg)  07/14/23 151 lb (68.5 kg)      Physical Exam Vitals and nursing note reviewed.  Constitutional:      Appearance: Normal appearance. He is not ill-appearing.  HENT:     Head: Normocephalic and atraumatic.     Mouth/Throat:     Mouth: Mucous membranes are moist.     Pharynx: Oropharynx  is clear. No oropharyngeal exudate or posterior oropharyngeal erythema.  Eyes:     Extraocular Movements: Extraocular movements intact.     Conjunctiva/sclera: Conjunctivae normal.     Pupils: Pupils are equal, round, and reactive to light.  Neck:     Thyroid : No thyroid  mass or thyromegaly.  Cardiovascular:     Rate and Rhythm: Normal rate and regular rhythm.     Pulses: Normal pulses.     Heart sounds: Normal heart sounds. No murmur heard. Pulmonary:     Effort: Pulmonary effort is normal. No respiratory distress.     Breath sounds: Normal breath sounds. No wheezing, rhonchi or rales.  Chest:     Chest wall: Tenderness present.       Comments: Reproducible tenderness to palpation along mid sternum as well as to L 2nd costochondral junction Possible small lipoma to subclavicular R chest wall Musculoskeletal:     Cervical back: Normal range of motion and neck supple.     Right lower leg: No edema.     Left lower leg: No edema.  Skin:    General: Skin is warm and dry.     Findings: No rash.     Comments: Small subungual hematoma to fingernail of right 3rd digit  Neurological:     General: No focal deficit present.     Mental Status: He is alert.     Cranial Nerves: Cranial nerves 2-12 are intact.     Sensory: Sensation is intact.     Motor: Motor function is intact.     Coordination: Coordination is intact.     Gait: Gait is intact.     Comments:  CN 2-12 intact FTN intact EOMI   Psychiatric:        Mood and Affect: Mood normal.        Behavior: Behavior normal.       Results for orders placed or performed in visit on 07/22/23  D-dimer, quantitative   Collection Time: 07/22/23  8:58 AM  Result Value Ref Range   D-Dimer, Quant <0.19 <0.50 mcg/mL FEU   Lab Results  Component Value Date   VD25OH 19.73 (L) 11/17/2022   Assessment & Plan:   Problem List Items Addressed This Visit     Vitamin D  insufficiency   Continue vitamin D  2000 units daily.  Update levels  when he returns for physical.       Seasonal allergic rhinitis   Discussed allergy  regimen      Non-cardiac chest pain   Story/exam consistent with MSK cause - costochondritis. Supportive measures reviewed - rec gentle chest wall stretching, ice/heat, topical voltaren, and OTC ibuprofen .  Update if ongoing or worsening, consider PT  referral.       Indigestion   Rec PRN omeprazole  20mg  use.       Subungual hematoma of finger of right hand - Primary   Mild, nailbed unaffected. Discussed anticipated course of resolution.       Light sensitivity   Of unclear cause. Not consistent with migraine. Vision screen normal today  Recommend schedule eye exam as no recent evaluation.  Recommend regular sunglass use when in bright light.  No current diagnosis to qualify for window tint permit.         No orders of the defined types were placed in this encounter.   No orders of the defined types were placed in this encounter.   Patient Instructions  Subungual hematoma should improve over time.  For eye sensitivity - schedule eye exam, use quality sunglasses regularly when out in the sun.  Vision screen today.  Chest wall pain suspicious for costochondritis again - use ice/heating pad covered in towel, topical voltaren to affected area, may take over the counter ibuprofen  200mg  2 tablets with meals for 3-5 days.  Return for fasting labs and physical in 1-3 months.   Follow up plan: Return in about 6 weeks (around 03/28/2024), or if symptoms worsen or fail to improve, for annual exam, prior fasting for blood work.  Claire Crick, MD

## 2024-02-16 NOTE — Telephone Encounter (Signed)
Discussed at OV yesterday

## 2024-02-22 ENCOUNTER — Encounter: Payer: Self-pay | Admitting: Internal Medicine

## 2024-02-22 ENCOUNTER — Ambulatory Visit (INDEPENDENT_AMBULATORY_CARE_PROVIDER_SITE_OTHER): Admitting: Internal Medicine

## 2024-02-22 VITALS — BP 110/82 | HR 69 | Temp 98.4°F | Ht 68.5 in | Wt 156.0 lb

## 2024-02-22 DIAGNOSIS — Z0000 Encounter for general adult medical examination without abnormal findings: Secondary | ICD-10-CM

## 2024-02-22 DIAGNOSIS — E7800 Pure hypercholesterolemia, unspecified: Secondary | ICD-10-CM

## 2024-02-22 DIAGNOSIS — J301 Allergic rhinitis due to pollen: Secondary | ICD-10-CM

## 2024-02-22 LAB — COMPREHENSIVE METABOLIC PANEL WITH GFR
ALT: 10 U/L (ref 0–53)
AST: 16 U/L (ref 0–37)
Albumin: 4.5 g/dL (ref 3.5–5.2)
Alkaline Phosphatase: 56 U/L (ref 39–117)
BUN: 12 mg/dL (ref 6–23)
CO2: 31 meq/L (ref 19–32)
Calcium: 9.7 mg/dL (ref 8.4–10.5)
Chloride: 102 meq/L (ref 96–112)
Creatinine, Ser: 1.12 mg/dL (ref 0.40–1.50)
GFR: 91.31 mL/min (ref >60.00)
Glucose, Bld: 87 mg/dL (ref 70–99)
Potassium: 4.2 meq/L (ref 3.5–5.1)
Sodium: 139 meq/L (ref 135–145)
Total Bilirubin: 2.1 mg/dL — ABNORMAL HIGH (ref 0.2–1.2)
Total Protein: 7.2 g/dL (ref 6.0–8.3)

## 2024-02-22 LAB — CBC
HCT: 44.5 % (ref 39.0–52.0)
Hemoglobin: 14.9 g/dL (ref 13.0–17.0)
MCHC: 33.6 g/dL (ref 30.0–36.0)
MCV: 91 fl (ref 78.0–100.0)
Platelets: 187 10*3/uL (ref 150.0–400.0)
RBC: 4.89 Mil/uL (ref 4.22–5.81)
RDW: 12.3 % (ref 11.5–15.5)
WBC: 3.1 10*3/uL — ABNORMAL LOW (ref 4.0–10.5)

## 2024-02-22 LAB — LIPID PANEL
Cholesterol: 257 mg/dL — ABNORMAL HIGH (ref 0–200)
HDL: 77.1 mg/dL (ref >39.00)
LDL Cholesterol: 168 mg/dL — ABNORMAL HIGH (ref 0–99)
NonHDL: 179.51
Total CHOL/HDL Ratio: 3
Triglycerides: 57 mg/dL (ref 0.0–149.0)
VLDL: 11.4 mg/dL (ref 0.0–40.0)

## 2024-02-22 MED ORDER — MONTELUKAST SODIUM 10 MG PO TABS: 10.0000 mg | ORAL_TABLET | Freq: Every day | ORAL | 4 refills | Status: AC

## 2024-02-22 NOTE — Assessment & Plan Note (Signed)
 Low risk--BP fine, no diabetes or FH Will just recheck

## 2024-02-22 NOTE — Assessment & Plan Note (Signed)
 Healthy Regular exercise and no tobacco Flu vaccine in the fall Normal testicular exam

## 2024-02-22 NOTE — Assessment & Plan Note (Signed)
Satisfied with regimen 

## 2024-02-22 NOTE — Progress Notes (Signed)
 Subjective:    Patient ID: Christopher Pittman, male    DOB: 1996/01/20, 28 y.o.   MRN: [id number]  HPI Here for physical  Doing okay Gets intermittent stomach type pain---is better Takes the omeprazole  as needed No dysphagia No relationship to eating or particular foods  Ongoing allergy  problems---spring is worst Satisfied with allergy  regimen  Current Outpatient Medications on File Prior to Visit  Medication Sig Dispense Refill   acetaminophen  (TYLENOL ) 500 MG tablet Take 1 tablet (500 mg total) by mouth every 12 (twelve) hours. (Patient taking differently: Take 500 mg by mouth every 12 (twelve) hours. As needed) 60 tablet 0   albuterol  (VENTOLIN  HFA) 108 (90 Base) MCG/ACT inhaler Inhale 2 puffs into the lungs every 6 (six) hours as needed for wheezing or shortness of breath. 8 g 1   cetirizine  (ZYRTEC ) 10 MG tablet Take 1 tablet (10 mg total) by mouth daily. 30 tablet 5   Cholecalciferol  (VITAMIN D3) 50 MCG (2000 UT) CAPS Take 1 capsule (2,000 Units total) by mouth daily.     fluticasone  (FLONASE ) 50 MCG/ACT nasal spray Place 2 sprays into both nostrils daily. 16 g 5   montelukast  (SINGULAIR ) 10 MG tablet Take 1 tablet (10 mg total) by mouth at bedtime. 90 tablet 4   Olopatadine  HCl 0.2 % SOLN Apply 1 drop to eye daily as needed (itchy watery eyes). 2.5 mL 5   omeprazole  (PRILOSEC) 20 MG capsule Take 1 capsule (20 mg total) by mouth daily. 90 capsule 1   No current facility-administered medications on file prior to visit.    No Known Allergies  Past Medical History:  Diagnosis Date   Costochondritis 08/14/2021   COVID-19 virus infection 05/12/2022   Exercise-induced asthma    in childhood   Open fracture of left tibia and fibula, type I or II, initial encounter 07/14/2021   Seasonal allergies    Tympanic membrane perforation, left 03/26/2020   Unintentional weight loss 08/14/2021   Vitamin D  insufficiency 07/15/2021    Past Surgical History:  Procedure Laterality Date    ADENOIDECTOMY     ESOPHAGOGASTRODUODENOSCOPY (EGD) WITH PROPOFOL  N/A 10/01/2021   WNL, biopsies WNL Christopher Pittman, Christopher Halon, MD)   I & D EXTREMITY Left 07/14/2021   Procedure: IRRIGATION AND DEBRIDEMENT EXTREMITY;  Surgeon: Christopher Lia, MD;  Location: [facility] OR;  Service: Orthopedics;  Laterality: Left;   MYRINGOTOMY     TIBIA IM NAIL INSERTION Left 07/14/2021   Procedure: INTRAMEDULLARY (IM) NAIL TIBIAL;  Surgeon: Christopher Lia, MD;  Location: [facility] OR;  Service: Orthopedics;  Laterality: Left;   TYMPANOPLASTY Left 04/08/2020   Procedure: LEFT TYMPANOPLASTY;  Surgeon: Christopher Mellow, MD;  Location: [facility];  Service: ENT;  Laterality: Left;    Family History  Problem Relation Age of Onset   Asthma Brother     Social History   Socioeconomic History   Marital status: Married    Spouse name: Not on file   Number of children: Not on file   Years of education: Not on file   Highest education level: Associate degree: academic program  Occupational History   Occupation: Event organiser: [id number]    Comment: Warehouse  Tobacco Use   Smoking status: Never   Smokeless tobacco: Never  Vaping Use   Vaping status: Never Used  Substance and Sexual Activity   Alcohol use: Never   Drug use: Never   Sexual activity: Not on file  Other Topics Concern   Not on file  Social History Narrative   Lives with mom and dad, 1 dog   Bachelor's from [place]   Social Drivers of Health   Financial Resource Strain: Low Risk  (02/15/2024)   Overall Financial Resource Strain (CARDIA)    Difficulty of Paying Living Expenses: Not hard at all  Food Insecurity: No Food Insecurity (02/15/2024)   Hunger Vital Sign    Worried About Running Out of Food in the Last Year: Never true    Ran Out of Food in the Last Year: Never true  Transportation Needs: No Transportation Needs (02/15/2024)   PRAPARE - Administrator, Civil Service (Medical): No    Lack of Transportation (Non-Medical): No   Physical Activity: Insufficiently Active (02/15/2024)   Exercise Vital Sign    Days of Exercise per Week: 2 days    Minutes of Exercise per Session: 30 min  Stress: No Stress Concern Present (02/15/2024)   [employer] of Occupational Health - Occupational Stress Questionnaire    Feeling of Stress : Not at all  Social Connections: Socially Integrated (02/15/2024)   Social Connection and Isolation Panel [NHANES]    Frequency of Communication with Friends and Family: More than three times a week    Frequency of Social Gatherings with Friends and Family: More than three times a week    Attends Religious Services: More than 4 times per year    Active Member of [employer] or Organizations: No    Attends Engineer, structural: More than 4 times per year    Marital Status: Married  Catering manager Violence: Not on file   Review of Systems  Constitutional:  Negative for fatigue and unexpected weight change.       Goes to gym--weights and cardio Wears seat belt  HENT:  Negative for dental problem, hearing loss and tinnitus.        Keeps up with dentist  Eyes:  Negative for visual disturbance.       No diplopia or unilateral vision loss Is sun sensitive--sunglasses helps  Respiratory:  Negative for cough, chest tightness and shortness of breath.        Uses inhaler prn---not in a while  Cardiovascular:  Negative for chest pain, palpitations and leg swelling.  Gastrointestinal:  Negative for blood in stool and constipation.  Endocrine: Negative for polydipsia and polyuria.  Genitourinary:  Negative for difficulty urinating and urgency.       No sexual problems  Skin:  Negative for rash.       No suspicious skin lesions  Allergic/Immunologic: Positive for environmental allergies. Negative for immunocompromised state.  Neurological:  Negative for dizziness, syncope and light-headedness.       Occasional headaches---like out in the sun  Hematological:  Negative for adenopathy. Does not  bruise/bleed easily.  Psychiatric/Behavioral:  Negative for dysphoric mood and sleep disturbance. The patient is not nervous/anxious.        Objective:   Physical Exam Constitutional:      Appearance: Normal appearance.  HENT:     Mouth/Throat:     Pharynx: No oropharyngeal exudate or posterior oropharyngeal erythema.  Eyes:     Conjunctiva/sclera: Conjunctivae normal.     Pupils: Pupils are equal, round, and reactive to light.  Cardiovascular:     Rate and Rhythm: Normal rate and regular rhythm.     Pulses: Normal pulses.     Heart sounds: No murmur heard.    No gallop.  Pulmonary:     Effort: Pulmonary effort is  normal.     Breath sounds: Normal breath sounds. No wheezing or rales.  Abdominal:     Palpations: Abdomen is soft.     Tenderness: There is no abdominal tenderness.  Genitourinary:    Testes: Normal.  Musculoskeletal:     Cervical back: Neck supple.     Right lower leg: No edema.     Left lower leg: No edema.  Lymphadenopathy:     Cervical: No cervical adenopathy.  Skin:    Findings: No lesion or rash.  Neurological:     General: No focal deficit present.     Mental Status: He is alert and oriented to person, place, and time.  Psychiatric:        Mood and Affect: Mood normal.        Behavior: Behavior normal.            Assessment & Plan:

## 2024-02-23 ENCOUNTER — Ambulatory Visit: Payer: Self-pay | Admitting: Internal Medicine

## 2024-03-29 ENCOUNTER — Encounter: Payer: Self-pay | Admitting: Family Medicine

## 2024-03-29 DIAGNOSIS — R6889 Other general symptoms and signs: Secondary | ICD-10-CM

## 2024-04-26 NOTE — Telephone Encounter (Signed)
 Faxed form to [facility] at [phone].  [Placed original at front office. Made copy to scan.]

## 2024-04-26 NOTE — Telephone Encounter (Signed)
 Form filled and in Christopher Pittman's box.

## 2024-05-04 ENCOUNTER — Ambulatory Visit: Admitting: Nurse Practitioner

## 2024-05-04 VITALS — BP 118/70 | HR 84 | Temp 97.6°F | Ht 68.5 in | Wt 158.2 lb

## 2024-05-04 DIAGNOSIS — R12 Heartburn: Secondary | ICD-10-CM | POA: Diagnosis not present

## 2024-05-04 MED ORDER — OMEPRAZOLE 20 MG PO CPDR
20.0000 mg | DELAYED_RELEASE_CAPSULE | Freq: Every day | ORAL | 0 refills | Status: DC
Start: 2024-05-04 — End: 2024-06-13

## 2024-05-04 NOTE — Assessment & Plan Note (Signed)
 GERD eating plan given the patient discharge.  Will do omeprazole  20 mg daily for 30 days.  Follow-up if no improvement

## 2024-05-04 NOTE — Progress Notes (Signed)
 Acute Office Visit  Subjective:     Patient ID: Christopher Pittman, male    DOB: Jul 19, 1996, 28 y.o.   MRN: [id number]  Chief Complaint  Patient presents with   Gastroesophageal Reflux    Pt complains of having ingestion since Tuesday. Pt has used OTC pepcid  and Prilosec but neither have worked.        Patient is in today for heartburn with a history of allergic fhinitis, HLD, non cardiac chest pian  Hx of endoscopy in 2022  States that he has been having indegestion since Tuesday. States that he is having an upset stomach with burping. No more flatus. States that he has not changed diet lately. States that he does hav approx 2 alocholic drinks a week. States that he does not use nsaids. States that he has tried one dose of pepcid  and omeprezole.   States that he has not noticed any food that makes it worse. States that it is discribed as discomfort  Last BM yesterday that was normal.  Traditionally has 1 daily  Review of Systems  Constitutional:  Negative for chills and fever.  Respiratory:  Negative for shortness of breath.   Cardiovascular:  Negative for chest pain.  Gastrointestinal:  Positive for abdominal pain and heartburn. Negative for constipation, diarrhea, nausea and vomiting.        Objective:    BP 118/70   Pulse 84   Temp 97.6 F (36.4 C) (Oral)   Ht 5' 8.5 (1.74 m)   Wt 158 lb 3.2 oz (71.8 kg)   SpO2 95%   BMI 23.70 kg/m  BP Readings from Last 3 Encounters:  05/04/24 118/70  02/22/24 110/82  02/15/24 116/84   Wt Readings from Last 3 Encounters:  05/04/24 158 lb 3.2 oz (71.8 kg)  02/22/24 156 lb (70.8 kg)  02/15/24 157 lb 4 oz (71.3 kg)   SpO2 Readings from Last 3 Encounters:  05/04/24 95%  02/22/24 98%  02/15/24 94%      Physical Exam Vitals and nursing note reviewed.  Constitutional:      Appearance: Normal appearance.  Cardiovascular:     Rate and Rhythm: Normal rate and regular rhythm.     Heart sounds: Normal heart sounds.   Pulmonary:     Effort: Pulmonary effort is normal.     Breath sounds: Normal breath sounds.  Abdominal:     General: Bowel sounds are normal. There is no distension.     Palpations: There is no mass.     Tenderness: There is no abdominal tenderness.     Hernia: No hernia is present.  Neurological:     Mental Status: He is alert.     No results found for any visits on 05/04/24.      Assessment & Plan:   Problem List Items Addressed This Visit       Other   Heartburn - Primary   GERD eating plan given the patient discharge.  Will do omeprazole  20 mg daily for 30 days.  Follow-up if no improvement      Relevant Medications   omeprazole  (PRILOSEC) 20 MG capsule    Meds ordered this encounter  Medications   omeprazole  (PRILOSEC) 20 MG capsule    Sig: Take 1 capsule (20 mg total) by mouth daily.    Dispense:  30 capsule    Refill:  0    Supervising Provider:   RANDEEN HARDY A [[id number]]    Return if symptoms worsen or fail to improve.  Adina Crandall, NP

## 2024-05-04 NOTE — Patient Instructions (Signed)
 Nice to see you today I have sent medications to the pharmacy to take daily for a month Follow up if you do not improve

## 2024-05-05 ENCOUNTER — Ambulatory Visit: Admitting: Family Medicine

## 2024-05-13 ENCOUNTER — Encounter

## 2024-05-15 ENCOUNTER — Ambulatory Visit: Admitting: Family Medicine

## 2024-05-16 ENCOUNTER — Telehealth: Payer: Self-pay

## 2024-05-16 ENCOUNTER — Ambulatory Visit: Admitting: Family

## 2024-05-16 ENCOUNTER — Ambulatory Visit: Admitting: Nurse Practitioner

## 2024-05-16 VITALS — BP 120/90 | HR 80 | Temp 98.1°F | Ht 68.5 in | Wt 156.0 lb

## 2024-05-16 DIAGNOSIS — R1013 Epigastric pain: Secondary | ICD-10-CM | POA: Diagnosis not present

## 2024-05-16 DIAGNOSIS — M25512 Pain in left shoulder: Secondary | ICD-10-CM | POA: Diagnosis not present

## 2024-05-16 LAB — COMPREHENSIVE METABOLIC PANEL WITH GFR
ALT: 9 U/L (ref 0–53)
AST: 13 U/L (ref 0–37)
Albumin: 4.4 g/dL (ref 3.5–5.2)
Alkaline Phosphatase: 45 U/L (ref 39–117)
BUN: 14 mg/dL (ref 6–23)
CO2: 29 meq/L (ref 19–32)
Calcium: 9.2 mg/dL (ref 8.4–10.5)
Chloride: 101 meq/L (ref 96–112)
Creatinine, Ser: 1.21 mg/dL (ref 0.40–1.50)
GFR: 83.09 mL/min (ref >60.00)
Glucose, Bld: 91 mg/dL (ref 70–99)
Potassium: 3.9 meq/L (ref 3.5–5.1)
Sodium: 137 meq/L (ref 135–145)
Total Bilirubin: 3.1 mg/dL — ABNORMAL HIGH (ref 0.2–1.2)
Total Protein: 7.2 g/dL (ref 6.0–8.3)

## 2024-05-16 LAB — LIPASE: Lipase: 13 U/L (ref 11.0–59.0)

## 2024-05-16 LAB — CBC
HCT: 45.1 % (ref 39.0–52.0)
Hemoglobin: 15 g/dL (ref 13.0–17.0)
MCHC: 33.3 g/dL (ref 30.0–36.0)
MCV: 89.4 fl (ref 78.0–100.0)
Platelets: 200 K/uL (ref 150.0–400.0)
RBC: 5.04 Mil/uL (ref 4.22–5.81)
RDW: 12.3 % (ref 11.5–15.5)
WBC: 3.6 K/uL — ABNORMAL LOW (ref 4.0–10.5)

## 2024-05-16 MED ORDER — SUCRALFATE 1 G PO TABS: 1.0000 g | ORAL_TABLET | Freq: Three times a day (TID) | ORAL | 0 refills | Status: DC

## 2024-05-16 NOTE — Telephone Encounter (Signed)
 I was unable to speak with pt; left detailed message per DPR wanting to verify pt was not having any CP or SOB.  Pt already has appt to see CHRISTELLA Crandall NP 05/16/24 at 9:40. Sending note to CHRISTELLA Crandall NP and [employer].

## 2024-05-16 NOTE — Progress Notes (Signed)
 Acute Office Visit  Subjective:     Patient ID: Christopher Pittman, male    DOB: 1996/08/20, 28 y.o.   MRN: [id number]  Chief Complaint  Patient presents with   Follow-up    Pt complains of dull aching pain in stomach with some L shoulder/upper back pain.     HPI Patient is in today for multiple complaints with a history of Allergic rhinitis, vitamin D  deficiency, HLD, heartburn.  Of note patient was recently seen by me on 05/04/2024 for heartburn when he was started on omeprazole  20 mg daily  States that he has been taking the omeprazole  daily  States that he is still having a dull achy pain in his stomach. He has not noticed anything has not made it worse. States that he does feel full when he should be jngry. States that he tried to eat yesterday and was able to eat a meal.  States that moving, eating, drinking, deep breathing does not effect it.   States that he has noticed that his left shoulder/back with hurting that is dull aching that is intermittne. States that he did noticed that after lifiting something it made it worse. He has had some intermitten numbness/tingling. Denies weakness   Review of Systems  Constitutional:  Negative for chills and fever.  Respiratory:  Negative for shortness of breath.   Cardiovascular:  Negative for chest pain.  Gastrointestinal:  Positive for abdominal pain and nausea. Negative for constipation, diarrhea and vomiting.       BM daily   Neurological:  Positive for tingling and weakness. Negative for dizziness and headaches.        Objective:    BP (!) 120/90   Pulse 80   Temp 98.1 F (36.7 C) (Oral)   Ht 5' 8.5 (1.74 m)   Wt 156 lb (70.8 kg)   SpO2 98%   BMI 23.37 kg/m  BP Readings from Last 3 Encounters:  05/16/24 (!) 120/90  05/04/24 118/70  02/22/24 110/82   Wt Readings from Last 3 Encounters:  05/16/24 156 lb (70.8 kg)  05/04/24 158 lb 3.2 oz (71.8 kg)  02/22/24 156 lb (70.8 kg)   SpO2 Readings from Last 3 Encounters:   05/16/24 98%  05/04/24 95%  02/22/24 98%      Physical Exam Vitals and nursing note reviewed.  Constitutional:      Appearance: Normal appearance.  Cardiovascular:     Rate and Rhythm: Normal rate and regular rhythm.     Heart sounds: Normal heart sounds.  Pulmonary:     Effort: Pulmonary effort is normal.     Breath sounds: Normal breath sounds.  Abdominal:     General: Bowel sounds are normal. There is no distension.     Palpations: There is no mass.     Tenderness: There is abdominal tenderness.     Hernia: No hernia is present.  Musculoskeletal:        General: Tenderness present.     Left shoulder: Tenderness present. No bony tenderness. Normal strength. Normal pulse.     Comments: + empty can test   Neurological:     Mental Status: He is alert.     Deep Tendon Reflexes:     Reflex Scores:      Bicep reflexes are 1+ on the right side and 1+ on the left side.    Comments: Bilateral upper extremity strength 5/5 Radial pulse 2+     No results found for any visits on 05/16/24.  Assessment & Plan:   Problem List Items Addressed This Visit       Other   Epigastric pain - Primary   History of the same.  Patient on omeprazole  20 mg daily without relief.  Will prescribe Carafate  1 g 4 times daily for 10 days.  Has been seen by gastroenterology in the past with a EGD within normal limits and biopsies in December 2022.  Will obtain CBC CMP lipase and H. pylori breath test close follow-up with primary care      Relevant Medications   sucralfate  (CARAFATE ) 1 g tablet   Other Relevant Orders   CBC   Comprehensive metabolic panel with GFR   H. pylori breath test   Lipase   Acute pain of left shoulder   Suspect shoulder impingement and muscle tightness.  Rehab exercises given.  Given patient is having epigastric discomfort and pain avoid NSAIDs with use Tylenol , heat, ice as needed       Meds ordered this encounter  Medications   sucralfate  (CARAFATE ) 1 g  tablet    Sig: Take 1 tablet (1 g total) by mouth 4 (four) times daily -  with meals and at bedtime.    Dispense:  40 tablet    Refill:  0    Supervising Provider:   RANDEEN HARDY A [[id number]]    Return in about 1 week (around 05/23/2024) for Epigastric pain with Dr. JUDITHANN Adina Crandall, NP

## 2024-05-16 NOTE — Telephone Encounter (Signed)
 Pt was seen in office today

## 2024-05-16 NOTE — Assessment & Plan Note (Signed)
 Suspect shoulder impingement and muscle tightness.  Rehab exercises given.  Given patient is having epigastric discomfort and pain avoid NSAIDs with use Tylenol , heat, ice as needed

## 2024-05-16 NOTE — Telephone Encounter (Signed)
 Christopher Pittman

## 2024-05-16 NOTE — Patient Instructions (Signed)
 Nice to see you today  I have sent in a medication that you will take 4 times a day  Follow up with Dr. KANDICE in 1 week

## 2024-05-16 NOTE — Assessment & Plan Note (Signed)
 History of the same.  Patient on omeprazole  20 mg daily without relief.  Will prescribe Carafate  1 g 4 times daily for 10 days.  Has been seen by gastroenterology in the past with a EGD within normal limits and biopsies in December 2022.  Will obtain CBC CMP lipase and H. pylori breath test close follow-up with primary care

## 2024-05-17 ENCOUNTER — Ambulatory Visit (INDEPENDENT_AMBULATORY_CARE_PROVIDER_SITE_OTHER)

## 2024-05-17 ENCOUNTER — Ambulatory Visit
Admission: EM | Admit: 2024-05-17 | Discharge: 2024-05-17 | Disposition: A | Discharge status: Home / Self Care | Attending: Physician Assistant | Admitting: Physician Assistant

## 2024-05-17 ENCOUNTER — Ambulatory Visit: Payer: Self-pay | Admitting: Nurse Practitioner

## 2024-05-17 ENCOUNTER — Ambulatory Visit: Admitting: Family Medicine

## 2024-05-17 ENCOUNTER — Encounter: Payer: Self-pay | Admitting: Emergency Medicine

## 2024-05-17 DIAGNOSIS — R17 Unspecified jaundice: Secondary | ICD-10-CM | POA: Diagnosis not present

## 2024-05-17 DIAGNOSIS — R079 Chest pain, unspecified: Secondary | ICD-10-CM | POA: Diagnosis not present

## 2024-05-17 DIAGNOSIS — R1010 Upper abdominal pain, unspecified: Secondary | ICD-10-CM

## 2024-05-17 DIAGNOSIS — R1013 Epigastric pain: Secondary | ICD-10-CM | POA: Diagnosis not present

## 2024-05-17 LAB — H. PYLORI BREATH TEST: H. pylori Breath Test: NOT DETECTED

## 2024-05-17 MED ORDER — FAMOTIDINE 20 MG PO TABS: 20.0000 mg | ORAL_TABLET | Freq: Two times a day (BID) | ORAL | 0 refills | Status: AC

## 2024-05-17 NOTE — Discharge Instructions (Addendum)
-   Your chest x-ray was normal. - I reviewed all of your lab work.  Your bilirubin level is elevated and has been a little elevated for a while.  Please discuss this further with your primary care provider.  Ask about obtaining some imaging such as an ultrasound of your right upper quadrant to further look at your liver and gallbladder and/or CT scan of your abdomen.  Also try to follow-up with GI again as you may need another endoscopy since it has been a couple of years. - Since you are not taking the omeprazole  because it was not helpful and you potentially had a bad reaction, start taking Pepcid  and eat smaller meals that are more nutritious. - If at any point your abdominal pain worsens, you have yellowing of the skin or eyes, excessive vomiting, weakness, fever, worsening chest pain, shortness of breath please go to the ER.

## 2024-05-17 NOTE — Telephone Encounter (Signed)
 Copied from CRM [id number]. Topic: Clinical - Request for Lab/Test Order >> May 17, 2024 11:21 AM Henretta I wrote: Reason for CRM: Patient needs x-ray ordered for chest and stomach

## 2024-05-17 NOTE — ED Triage Notes (Signed)
 Pt presents with umbilical abdominal pain, chest pain and back pain for 4 days. Pt was seen by his PCP yesterday and had some lab work performed. He did not get a chest x-ray and is wanting that today.

## 2024-05-17 NOTE — ED Provider Notes (Signed)
 [facility]    CSN: [id number] Arrival date & time: 05/17/24  1936      History   Chief Complaint Chief Complaint  Patient presents with   Abdominal Pain   Back Pain   Chest Pain    HPI Christopher Pittman is a 28 y.o. male presenting with his mother for epigastric pain, periumbilical pain, chest pain and back pain x 4 days. Seen by PCP yesterday and end of July for these symptoms. History of GERD, currently prescribed omeprazole . Reports dull, achy pain in abdomen that comes and goes.  The pain sometimes radiates into chest.  No associated fevers, weight loss, cough, shortness of breath, palpitations, dizziness, nausea/vomiting/diarrhea or constipation.  Reports a mildly reduced appetite.  Admits that his is not the best.  He says he is only had a bag of plain Lays potato chips today.  No aggravating factors. States that moving, eating, drinking, deep breathing does not affect the pain.  Patient had CBC, lipase, CMP and H. Pylori breath test done. Has been found to have elevated bilirubin, but has not been worked up for a cause. No personal or family history of liver or gallbladder disease.   Patient requesting a chest xray. States his PCP wanted him to have one.   Reports seeing GI for endoscopy about 2 years ago and says everything was normal so they took him off the Pepcid . He has been prescribed omeprazole  by PCP but admits to not taking it because it does not work and he has unknown intolerance/reaction to it.  No significant history of IBS, IBD, Celiac Disease.    HPI  Past Medical History:  Diagnosis Date   Costochondritis 08/14/2021   COVID-19 virus infection 05/12/2022   Exercise-induced asthma    in childhood   Open fracture of left tibia and fibula, type I or II, initial encounter 07/14/2021   Seasonal allergies    Tympanic membrane perforation, left 03/26/2020   Unintentional weight loss 08/14/2021   Vitamin D  insufficiency 07/15/2021    Patient Active  Problem List   Diagnosis Date Noted   Heartburn 05/04/2024   Light sensitivity 02/15/2024   Acute pain of left shoulder 12/01/2022   Health maintenance examination 11/24/2022   Pure hypercholesterolemia 11/15/2022   Non-cardiac chest pain 12/24/2021   Left-sided headache 12/16/2021   Epigastric pain    Abdominal discomfort 08/14/2021   Vitamin D  insufficiency 07/15/2021   Seasonal allergic rhinitis 03/09/2018    Past Surgical History:  Procedure Laterality Date   ADENOIDECTOMY     ESOPHAGOGASTRODUODENOSCOPY (EGD) WITH PROPOFOL  N/A 10/01/2021   WNL, biopsies WNL (Vanga, Corinn Skiff, MD)   I & D EXTREMITY Left 07/14/2021   Procedure: IRRIGATION AND DEBRIDEMENT EXTREMITY;  Surgeon: Celena Sharper, MD;  Location: [facility] OR;  Service: Orthopedics;  Laterality: Left;   MYRINGOTOMY     TIBIA IM NAIL INSERTION Left 07/14/2021   Procedure: INTRAMEDULLARY (IM) NAIL TIBIAL;  Surgeon: Celena Sharper, MD;  Location: [facility] OR;  Service: Orthopedics;  Laterality: Left;   TYMPANOPLASTY Left 04/08/2020   Procedure: LEFT TYMPANOPLASTY;  Surgeon: Jesus Oliphant, MD;  Location: [facility];  Service: ENT;  Laterality: Left;       Home Medications    Prior to Admission medications   Medication Sig Start Date End Date Taking? Authorizing Provider  famotidine  (PEPCID ) 20 MG tablet Take 1 tablet (20 mg total) by mouth 2 (two) times daily. 05/17/24  Yes Arvis Huxley B, PA-C  sucralfate  (CARAFATE ) 1 g tablet  Take 1 tablet (1 g total) by mouth 4 (four) times daily -  with meals and at bedtime. 05/16/24  Yes Wendee Lynwood HERO, NP  acetaminophen  (TYLENOL ) 500 MG tablet Take 1 tablet (500 mg total) by mouth every 12 (twelve) hours. Patient taking differently: Take 500 mg by mouth every 12 (twelve) hours. As needed 07/15/21   Deward Eck, PA-C  albuterol  (VENTOLIN  HFA) 108 (90 Base) MCG/ACT inhaler Inhale 2 puffs into the lungs every 6 (six) hours as needed for wheezing or shortness of breath. 01/13/23    Tobie Arleta SQUIBB, MD  cetirizine  (ZYRTEC ) 10 MG tablet Take 1 tablet (10 mg total) by mouth daily. 01/13/23   Tobie Arleta SQUIBB, MD  Cholecalciferol  (VITAMIN D3) 50 MCG (2000 UT) CAPS Take 1 capsule (2,000 Units total) by mouth daily. 12/01/22   Rilla Baller, MD  fluticasone  (FLONASE ) 50 MCG/ACT nasal spray Place 2 sprays into both nostrils daily. 01/13/23   Tobie Arleta SQUIBB, MD  montelukast  (SINGULAIR ) 10 MG tablet Take 1 tablet (10 mg total) by mouth at bedtime. 02/22/24   Jimmy Charlie FERNS, MD  Olopatadine  HCl 0.2 % SOLN Apply 1 drop to eye daily as needed (itchy watery eyes). 01/13/23   Tobie Arleta SQUIBB, MD  omeprazole  (PRILOSEC) 20 MG capsule Take 1 capsule (20 mg total) by mouth daily. 05/04/24   Wendee Lynwood HERO, NP    Family History Family History  Problem Relation Age of Onset   Asthma Brother     Social History Social History   Tobacco Use   Smoking status: Never   Smokeless tobacco: Never  Vaping Use   Vaping status: Never Used  Substance Use Topics   Alcohol use: Yes    Comment: social   Drug use: Never     Allergies   Patient has no known allergies.   Review of Systems Review of Systems  Constitutional:  Negative for appetite change, fatigue, fever and unexpected weight change.  HENT:  Negative for congestion.   Respiratory:  Negative for cough and shortness of breath.   Cardiovascular:  Positive for chest pain. Negative for palpitations.  Gastrointestinal:  Positive for abdominal pain. Negative for blood in stool, constipation, diarrhea, nausea and vomiting.  Genitourinary:  Negative for flank pain.  Musculoskeletal:  Positive for back pain (upper back and shoulder aching--unrelated to chief complaint).  Neurological:  Negative for dizziness, weakness and headaches.     Physical Exam Triage Vital Signs ED Triage Vitals  Encounter Vitals Group     BP      Girls Systolic BP Percentile      Girls Diastolic BP Percentile      Boys Systolic BP Percentile      Boys  Diastolic BP Percentile      Pulse      Resp      Temp      Temp src      SpO2      Weight      Height      Head Circumference      Peak Flow      Pain Score      Pain Loc      Pain Education      Exclude from Growth Chart    No data found.  Updated Vital Signs BP 137/87 (BP Location: Right Arm)   Pulse 88   Temp 98.4 F (36.9 C) (Oral)   Resp 18   SpO2 97%     Physical Exam Vitals and  nursing note reviewed.  Constitutional:      General: He is not in acute distress.    Appearance: He is well-developed.  HENT:     Head: Normocephalic and atraumatic.  Eyes:     Conjunctiva/sclera: Conjunctivae normal.  Cardiovascular:     Rate and Rhythm: Normal rate and regular rhythm.  Pulmonary:     Effort: Pulmonary effort is normal. No respiratory distress.     Breath sounds: Normal breath sounds.  Abdominal:     Palpations: Abdomen is soft.     Tenderness: There is abdominal tenderness in the epigastric area. There is no guarding or rebound.  Musculoskeletal:     Cervical back: Neck supple.  Skin:    General: Skin is warm and dry.     Capillary Refill: Capillary refill takes less than 2 seconds.  Neurological:     General: No focal deficit present.     Mental Status: He is alert. Mental status is at baseline.     Motor: No weakness.     Gait: Gait normal.  Psychiatric:        Mood and Affect: Mood normal.        Behavior: Behavior normal.      UC Treatments / Results  Labs (all labs ordered are listed, but only abnormal results are displayed) Labs Reviewed - No data to display Comprehensive metabolic panel with GFR          Component Ref Range & Units (hover) 1 d ago (05/16/24) 2 mo ago (02/22/24) 10 mo ago (07/19/23) 1 yr ago (11/17/22) 2 yr ago (12/24/21) 2 yr ago (08/04/21) 2 yr ago (07/23/21)  Sodium 137 139 137 140 137 135 R 136 R  Potassium 3.9 4.2 4.4 4.0 4.0 4.1 R 4.4 R  Chloride 101 102 102 103 101 99 R 100 R  CO2 29 31 30 25 29 27  R 26 R  Glucose,  Bld 91 87 96 92 103 High  115 High  CM 116 High  CM  BUN 14 12 13 14 18 15  R 17 R  Creatinine, Ser 1.21 1.12 1.14 1.16 1.09 1.04 R 1.03 R  Total Bilirubin 3.1 High  2.1 High  2.6 High  2.2 High  2.2 High   1.7 High  R  Alkaline Phosphatase 45 56 55 52 59  65 R  AST 13 16 13 13 11  22  R  ALT 9 10 10 10 8  21  R  Total Protein 7.2 7.2 7.3 7.0 6.7  7.2 R  Albumin 4.4 4.5 4.4 4.4 4.5  3.8 R  GFR 83.09 91.31 CM 89.77 CM 88.33 CM 95.78 CM    Comment: Calculated using the CKD-EPI Creatinine Equation (2021)  Calcium 9.2 9.7 9.6 9.2 9.7 9.6 R 9.4 R  Resulting Agency [place] [facility] [place] [facility] [place] [facility] [place] [facility] [place] [facility]      CBC           Component Ref Range & Units (hover) 1 d ago (05/16/24) 2 mo ago (02/22/24) 10 mo ago (07/19/23) 2 yr ago (12/24/21) 2 yr ago (08/04/21) 2 yr ago (07/23/21) 2 yr ago (07/15/21)  WBC 3.6 Low  3.1 Low  3.2 Low  10.0 9.0 9.2 8.5  RBC 5.04 4.89 4.98 4.57 4.53 R 3.92 Low  R 4.05 Low  R  Platelets 200.0 187.0 201.0 199.0 337 R 265 R 171 R  Hemoglobin 15.0 14.9 15.0 14.0 14.4 11.9 Low  12.5  Low   HCT 45.1 44.5 45.5 41.4 41.0 36.3 Low  36.9 Low   MCV 89.4 91.0 91.4 90.5 90.5 R 92.6 R 91.1 R  MCHC 33.3 33.6 33.1 33.8 35.1 32.8 33.9  RDW 12.3 12.3 11.8 12.4 11.3 Low  11.1 Low  10.9 Low   MCH     31.8 R 30.4 R 30.9 R  Eosinophils Absolute      0.0 R   Basophils Relative      0 R   Basophils Absolute      0.0 R   Immature Granulocytes      0 R   Abs Immature Granulocytes      0.04 R, CM   nRBC     0.0 R, CM 0.0 R 0.0 R, CM  Neutrophils Relative %      88 R   Neutro Abs      8.1 High  R   Lymphocytes Relative      7 R   Lymphs Abs      0.7 R   Monocytes Relative      5 R   Monocytes Absolute      0.5 R   Eosinophils Relative      0 R       Lipase    Component Ref Range & Units (hover) 1 d ago  Lipase 13.0  Resulting Agency [place] [facility]       H. pylori breath test    Component Ref Range & Units  (hover) 1 d ago  H. pylori Breath Test NOT DETECTED  Comment: . Antimicrobials, proton pump inhibitors, and bismuth preparations are known to suppress H. pylori, and ingestion of these prior to H. pylori diagnostic testing may lead to false negative results. If clinically indicated, the test may be repeated on a new specimen obtained two weeks after discontinuing treatment. However, a positive result is still clinically valid.  Resulting Agency [facility] [place]       Lipid panel      Component Ref Range & Units (hover) 2 mo ago 1 yr ago 2 yr ago  Cholesterol 257 High  245 High  CM 243 High  CM  Comment: ATP III Classification       Desirable:  < 200 mg/dL               Borderline High:  200 - 239 mg/dL          High:  > = 759 mg/dL  Triglycerides 42.9 35.9 CM 51.0 CM  Comment: Normal:  <150 mg/dLBorderline High:  150 - 199 mg/dL  HDL 22.89 28.89 21.19  VLDL 11.4 12.8 10.2  LDL Cholesterol 168 High  838 High  154 High   Total CHOL/HDL Ratio 3 3 CM 3 CM  Comment:                Men          Women1/2 Average Risk     3.4          3.3Average Risk          5.0          4.42X Average Risk          9.6          7.13X Average Risk          15.0          11.0  NonHDL 179.51 173.91 CM 163.89 CM  Comment: NOTE:  Non-HDL goal should be 30 mg/dL higher than patient's LDL goal (i.e. LDL goal of < 70 mg/dL, would have non-HDL goal of < 100 mg/dL)  Resulting Agency [place] [facility] [place] [facility] [place] [facility]          EKG   Radiology No results found.  Procedures Procedures (including critical care time)  Medications Ordered in UC Medications - No data to display  Initial Impression / Assessment and Plan / UC Course  I have reviewed the triage vital signs and the nursing notes.  Pertinent labs & imaging results that were available during my care of the patient were reviewed by me and considered in my medical decision making (see chart for  details).   28 y/o male presents for epigastric, periumbilical, chest and upper back pain. Hx GERD. Patient is currently being seen and worked up for his symptoms by PCP. He was actually seen yesterday.   Reviewed all labs and progress notes recently done yesterday. CBC with low WBC 3.6. Lipase normal. Negative H. Pylori  breath test. CMP with elevated total bili 3.1. It was up to 2.6 10 months ago. Patient has not had any abdominal imaging.  On evaluation today, he has mild epigastric tenderness otherwise normal exam.  Patient requesting chest x-ray today. Ordered and reviewed. No acute findings. Will contact patient if overread differs.   At this time advised patient to start taking Pepcid  again.  He is not taking omeprazole  as prescribed because he has had an unknown reaction to it but did take Pepcid  previously and tolerated it fine.  We discussed dietary changes.  Explained that he needs to have a more nutritious meals.  Continue to follow-up with PCP.  Discussed following up with GI as well.  It has been a couple years since he had endoscopy and may need another.  We also discussed continuing to speak to PCP about elevated bilirubin level and see if they might like to perform some imaging such as ultrasound or CT of abdomen to evaluate this further.  Advised going to ER if fever or acute worsening of pain before he can follow-up with PCP and GI.  Chest x-ray over read negative.   Final Clinical Impressions(s) / UC Diagnoses   Final diagnoses:  Chest pain, unspecified type  Upper abdominal pain  Serum total bilirubin elevated     Discharge Instructions      - Your chest x-ray was normal. - I reviewed all of your lab work.  Your bilirubin level is elevated and has been a little elevated for a while.  Please discuss this further with your primary care provider.  Ask about obtaining some imaging such as an ultrasound of your right upper quadrant to further look at your liver and  gallbladder and/or CT scan of your abdomen.  Also try to follow-up with GI again as you may need another endoscopy since it has been a couple of years. - Since you are not taking the omeprazole  because it was not helpful and you potentially had a bad reaction, start taking Pepcid  and eat smaller meals that are more nutritious. - If at any point your abdominal pain worsens, you have yellowing of the skin or eyes, excessive vomiting, weakness, fever, worsening chest pain, shortness of breath please go to the ER.     ED Prescriptions     Medication Sig Dispense Auth. Provider   famotidine  (PEPCID ) 20 MG tablet Take 1 tablet (  20 mg total) by mouth 2 (two) times daily. 60 tablet Dena Esperanza B, PA-C      PDMP not reviewed this encounter.   Arvis Jolan NOVAK, PA-C 05/18/24 [phone]

## 2024-05-18 ENCOUNTER — Ambulatory Visit: Payer: Self-pay

## 2024-05-18 ENCOUNTER — Telehealth: Payer: Self-pay

## 2024-05-18 NOTE — Telephone Encounter (Signed)
 Copied from CRM #[id number]. Topic: Clinical - Request for Lab/Test Order >> May 18, 2024  8:17 AM Montie POUR wrote: Reason for CRM:  He went to urgent care yesterday (notes are in his chart) and they want his primary doctor to order an ultra sound on his gallbladder and liver to make sure it is okay. Please call [facility] to discuss at [phone].

## 2024-05-18 NOTE — Telephone Encounter (Signed)
Spoke with pt relaying Dr. G's message. Pt verbalizes understanding.  

## 2024-05-18 NOTE — Telephone Encounter (Signed)
 Fwd to [facility] in Dr Talmadge absence. Pt seen on 05/16/24 by [facility] for GI sxs.  Also, fwd to Dr KANDICE as rick

## 2024-05-18 NOTE — Telephone Encounter (Addendum)
 Agree with recommendations made. I will see him next week when he returns from [facility].  I do recommend he avoid greasy fatty meals with fried foods until further evaluation.

## 2024-05-18 NOTE — Telephone Encounter (Signed)
 I spoke with pt; pt said he began to feel worse on 05/17/24 with dull abd pain and on and off CP and back pain. No N,V or diarrhea.on 05/17/24 pt seen [facility]; pt said he was advised at [facility] he needed to FU with PCP for US  of RUOQ to ck GB and  liver and CT of Abd and to discuss elevated bilirubin. Offered pt appt to see Dr KANDICE for FU and pt advised he is feelling better today and pt is leaving on 05/19/24 at 8 AM for [place]  for 1 wk pt is returning home on the morning of 05/26/24. I  spoke with Dr Jimmy and CHRISTELLA Crandall NP because pt wanted to know if could get CT and US  prior to his leaving in AM. Both agreed not likely to get imaging and US  by early morning and pt does need to FU with DR G. Advised pt if wants to stay in town can schedule pt FU appt to see what additional testing is needed if any.or if pt feeling well enough to travel can continue plans for [facility] with UC & ED precautions while gone out of town. Pt voiced understanding and pt said there are UC & EDs where pt is traveling to and pt is feeling well enough to go on trip. Pt did scheduled UC FU with Dr KANDICE on 05/26/24 at 12:30. Pt will continue meds as prescribed Sending note to DR G who is out of office and Adina Crandall NP and Dr Jimmy who are in office.

## 2024-05-18 NOTE — Telephone Encounter (Signed)
 Christopher Pittman came and talked to me. This is not something emergent that needs to be done now. His exam did not warrant that when I saw him. He was suppose to follow up with Dr. KANDICE 1 week after he saw me. If he is not scheduled please get him scheduled. They can discuss imaging at that point

## 2024-05-25 ENCOUNTER — Encounter: Payer: Self-pay | Admitting: Family Medicine

## 2024-05-26 ENCOUNTER — Ambulatory Visit: Admitting: Family Medicine

## 2024-05-26 ENCOUNTER — Encounter: Payer: Self-pay | Admitting: Family Medicine

## 2024-05-26 VITALS — BP 124/82 | HR 76 | Temp 98.7°F | Ht 68.5 in | Wt 160.2 lb

## 2024-05-26 DIAGNOSIS — D72819 Decreased white blood cell count, unspecified: Secondary | ICD-10-CM | POA: Diagnosis not present

## 2024-05-26 DIAGNOSIS — R17 Unspecified jaundice: Secondary | ICD-10-CM | POA: Diagnosis not present

## 2024-05-26 DIAGNOSIS — R109 Unspecified abdominal pain: Secondary | ICD-10-CM

## 2024-05-26 LAB — URINALYSIS, ROUTINE W REFLEX MICROSCOPIC
Bilirubin Urine: NEGATIVE
Hgb urine dipstick: NEGATIVE
Ketones, ur: NEGATIVE
Leukocytes,Ua: NEGATIVE
Nitrite: NEGATIVE
RBC / HPF: NONE SEEN (ref 0–?)
Specific Gravity, Urine: 1.02 (ref 1.000–1.030)
Total Protein, Urine: NEGATIVE
Urine Glucose: NEGATIVE
Urobilinogen, UA: 0.2 (ref 0.0–1.0)
WBC, UA: NONE SEEN (ref 0–?)
pH: 8 (ref 5.0–8.0)

## 2024-05-26 LAB — CBC WITH DIFFERENTIAL/PLATELET
Absolute Lymphocytes: 1369 {cells}/uL (ref 850–3900)
Absolute Monocytes: 431 {cells}/uL (ref 200–950)
Basophils Absolute: 0 {cells}/uL (ref 0–200)
Basophils Relative: 0 %
Eosinophils Absolute: 41 {cells}/uL (ref 15–500)
Eosinophils Relative: 1 %
HCT: 44.5 % (ref 38.5–50.0)
Hemoglobin: 14.5 g/dL (ref 13.2–17.1)
MCH: 30.3 pg (ref 27.0–33.0)
MCHC: 32.6 g/dL (ref 32.0–36.0)
MCV: 92.9 fL (ref 80.0–100.0)
MPV: 10.6 fL (ref 7.5–12.5)
Monocytes Relative: 10.5 %
Neutro Abs: 2259 {cells}/uL (ref 1500–7800)
Neutrophils Relative %: 55.1 %
Platelets: 196 Thousand/uL (ref 140–400)
RBC: 4.79 Million/uL (ref 4.20–5.80)
RDW: 11.2 % (ref 11.0–15.0)
Total Lymphocyte: 33.4 %
WBC: 4.1 Thousand/uL (ref 3.8–10.8)

## 2024-05-26 NOTE — Progress Notes (Signed)
 Ph: [phone] Fax: [phone]   Patient ID: Christopher Pittman, male    DOB: 1996/02/09, 28 y.o.   MRN: [id number]  This visit was conducted in person.  BP 124/82   Pulse 76   Temp 98.7 F (37.1 C) (Oral)   Ht 5' 8.5 (1.74 m)   Wt 160 lb 4 oz (72.7 kg)   SpO2 96%   BMI 24.01 kg/m    CC: urgent care follow up  Subjective:   HPI: Christopher Pittman is a 28 y.o. male presenting on 05/26/2024 for Medical Management of Chronic Issues (Pt here for F/U Abd Pain. Feels a little discomfort.)   Just returned from cruise to [place].   Recent [facility] visit 05/17/2024 for ongoing abdominal chest and back pain for about a week. Work up included normal H pylori breath test, normal lipase, WBC 3.6 (L) with normal Hgb and platelets, and elevated Tbili to 3.1, other LFTs normal, Cr 1.21 with GFR 83.   Notes 2 wk h/o dull ache to epigastric to right upper quadrant without aggravating or alleviating factors noted. Spicy foods doesn't worsen this pain. Occasional nausea. No change with greasy foods or alcohol   Has previously been treated with omeprazole  without benefit. most recently tried famotidine  with some benefit.   No fevers/chills, nausea/vomiting, diarrhea/constipation, gerd or reflux heartburn, no yellowing of skin or eyes.   Regularly on zyrtec  and singulair  for allergies, as well as pepcid  20mg  bid.   CTA chest 07/2021 - unremarkable upper abdomen.   Sparing tylenol  500mg   NSAID intermittently  Alcohol - 1-2 glasses of wine every few weeks.  No rec drugs No smoking.       Relevant past medical, surgical, family and social history reviewed and updated as indicated. Interim medical history since our last visit reviewed. Allergies and medications reviewed and updated. Outpatient Medications Prior to Visit  Medication Sig Dispense Refill   acetaminophen  (TYLENOL ) 500 MG tablet Take 1 tablet (500 mg total) by mouth every 12 (twelve) hours. (Patient taking differently: Take 500 mg by  mouth every 12 (twelve) hours. As needed) 60 tablet 0   albuterol  (VENTOLIN  HFA) 108 (90 Base) MCG/ACT inhaler Inhale 2 puffs into the lungs every 6 (six) hours as needed for wheezing or shortness of breath. 8 g 1   cetirizine  (ZYRTEC ) 10 MG tablet Take 1 tablet (10 mg total) by mouth daily. 30 tablet 5   Cholecalciferol  (VITAMIN D3) 50 MCG (2000 UT) CAPS Take 1 capsule (2,000 Units total) by mouth daily.     famotidine  (PEPCID ) 20 MG tablet Take 1 tablet (20 mg total) by mouth 2 (two) times daily. 60 tablet 0   fluticasone  (FLONASE ) 50 MCG/ACT nasal spray Place 2 sprays into both nostrils daily. 16 g 5   montelukast  (SINGULAIR ) 10 MG tablet Take 1 tablet (10 mg total) by mouth at bedtime. 90 tablet 4   Olopatadine  HCl 0.2 % SOLN Apply 1 drop to eye daily as needed (itchy watery eyes). 2.5 mL 5   omeprazole  (PRILOSEC) 20 MG capsule Take 1 capsule (20 mg total) by mouth daily. (Patient not taking: Reported on 05/26/2024) 30 capsule 0   sucralfate  (CARAFATE ) 1 g tablet Take 1 tablet (1 g total) by mouth 4 (four) times daily -  with meals and at bedtime. (Patient not taking: Reported on 05/26/2024) 40 tablet 0   No facility-administered medications prior to visit.     Per HPI unless specifically indicated in ROS section below Review of Systems  Objective:  BP 124/82   Pulse 76   Temp 98.7 F (37.1 C) (Oral)   Ht 5' 8.5 (1.74 m)   Wt 160 lb 4 oz (72.7 kg)   SpO2 96%   BMI 24.01 kg/m   Wt Readings from Last 3 Encounters:  05/26/24 160 lb 4 oz (72.7 kg)  05/16/24 156 lb (70.8 kg)  05/04/24 158 lb 3.2 oz (71.8 kg)      Physical Exam Vitals and nursing note reviewed.  Constitutional:      Appearance: Normal appearance. He is not ill-appearing.  HENT:     Head: Normocephalic and atraumatic.     Mouth/Throat:     Mouth: Mucous membranes are moist.     Pharynx: Oropharynx is clear. No oropharyngeal exudate or posterior oropharyngeal erythema.  Eyes:     General: No scleral icterus.        Right eye: No discharge.        Left eye: No discharge.     Extraocular Movements: Extraocular movements intact.     Conjunctiva/sclera: Conjunctivae normal.     Pupils: Pupils are equal, round, and reactive to light.  Cardiovascular:     Rate and Rhythm: Normal rate and regular rhythm.     Pulses: Normal pulses.     Heart sounds: Normal heart sounds. No murmur heard. Pulmonary:     Effort: Pulmonary effort is normal. No respiratory distress.     Breath sounds: Normal breath sounds. No wheezing, rhonchi or rales.  Abdominal:     General: Bowel sounds are normal. There is no distension.     Palpations: Abdomen is soft. There is no mass.     Tenderness: There is abdominal tenderness (mild) in the right upper quadrant and epigastric area. There is no guarding or rebound. Negative signs include Murphy's sign.     Hernia: No hernia is present.  Musculoskeletal:     Right lower leg: No edema.     Left lower leg: No edema.  Skin:    General: Skin is warm and dry.     Coloration: Skin is not jaundiced.     Findings: No rash.  Neurological:     Mental Status: He is alert.  Psychiatric:        Mood and Affect: Mood normal.        Behavior: Behavior normal.       Results for orders placed or performed in visit on 05/16/24  CBC   Collection Time: 05/16/24 10:12 AM  Result Value Ref Range   WBC 3.6 (L) 4.0 - 10.5 K/uL   RBC 5.04 4.22 - 5.81 Mil/uL   Platelets 200.0 150.0 - 400.0 K/uL   Hemoglobin 15.0 13.0 - 17.0 g/dL   HCT 54.8 60.9 - 47.9 %   MCV 89.4 78.0 - 100.0 fl   MCHC 33.3 30.0 - 36.0 g/dL   RDW 87.6 88.4 - 84.4 %  Comprehensive metabolic panel with GFR   Collection Time: 05/16/24 10:12 AM  Result Value Ref Range   Sodium 137 135 - 145 mEq/L   Potassium 3.9 3.5 - 5.1 mEq/L   Chloride 101 96 - 112 mEq/L   CO2 29 19 - 32 mEq/L   Glucose, Bld 91 70 - 99 mg/dL   BUN 14 6 - 23 mg/dL   Creatinine, Ser 8.78 0.40 - 1.50 mg/dL   Total Bilirubin 3.1 (H) 0.2 - 1.2 mg/dL    Alkaline Phosphatase 45 39 - 117 U/L   AST 13 0 - 37  U/L   ALT 9 0 - 53 U/L   Total Protein 7.2 6.0 - 8.3 g/dL   Albumin 4.4 3.5 - 5.2 g/dL   GFR 16.90 >39.99 mL/min   Calcium 9.2 8.4 - 10.5 mg/dL  H. pylori breath test   Collection Time: 05/16/24 10:12 AM  Result Value Ref Range   H. pylori Breath Test NOT DETECTED NOT DETECTED  Lipase   Collection Time: 05/16/24 10:12 AM  Result Value Ref Range   Lipase 13.0 11.0 - 59.0 U/L    Assessment & Plan:   Problem List Items Addressed This Visit     Abdominal discomfort - Primary   2 wks of abdominal discomfort not consistent with biliary colic associated with mild nausea, and increasing total bilirubin levels, mild.  Not food related, not med related.  Recent lipase and H pylori normal. Other LFTs normal.  Pepcid  is helping - may continue this.  Will order abd US  for further eval/imaging.  Consider return to GI pending results.       Relevant Orders   US  Abdomen Complete   Serum total bilirubin elevated   Persistent, increasing over the past few years.  With associated abdominal discomfort, recommend further evaluation with fractionating bilirubin, urinalysis, CBC, retic count and periph smear (in h/o mild recent leukopenia).  Gilbert's is dx of exclusion.       Relevant Orders   Bilirubin, fractionated(tot/dir/indir)   Urinalysis, Routine w reflex microscopic   Reticulocytes   US  Abdomen Complete   CBC with Differential   Peripheral Blood Smear Review   Leukopenia   Mild, persistent over the past year. No anemia. Update CBC with periph smear.       Relevant Orders   CBC with Differential   Peripheral Blood Smear Review     No orders of the defined types were placed in this encounter.   Orders Placed This Encounter  Procedures   US  Abdomen Complete    Standing Status:   Future    Expiration Date:   05/26/2025    Reason for Exam (SYMPTOM  OR DIAGNOSIS REQUIRED):   abd pain and hyperbilirubinemia    Preferred  imaging location?:   [facility]   Bilirubin, fractionated(tot/dir/indir)   Urinalysis, Routine w reflex microscopic   Reticulocytes   CBC with Differential   Peripheral Blood Smear Review    Patient Instructions  Urine and labs today  I have ordered abdominal ultrasound to outpatient imaging center at [facility]  We will be in touch with results Continue pepcid  at this time.   Follow up plan: Return if symptoms worsen or fail to improve.  Anton Blas, MD

## 2024-05-26 NOTE — Assessment & Plan Note (Signed)
 2 wks of abdominal discomfort not consistent with biliary colic associated with mild nausea, and increasing total bilirubin levels, mild.  Not food related, not med related.  Recent lipase and H pylori normal. Other LFTs normal.  Pepcid  is helping - may continue this.  Will order abd US  for further eval/imaging.  Consider return to GI pending results.

## 2024-05-26 NOTE — Assessment & Plan Note (Signed)
 Persistent, increasing over the past few years.  With associated abdominal discomfort, recommend further evaluation with fractionating bilirubin, urinalysis, CBC, retic count and periph smear (in h/o mild recent leukopenia).  Gilbert's is dx of exclusion.

## 2024-05-26 NOTE — Assessment & Plan Note (Addendum)
 Mild, persistent over the past year. No anemia. Update CBC with periph smear.

## 2024-05-26 NOTE — Patient Instructions (Signed)
 Urine and labs today  I have ordered abdominal ultrasound to outpatient imaging center at [facility]  We will be in touch with results Continue pepcid  at this time.

## 2024-05-29 ENCOUNTER — Ambulatory Visit: Admitting: Family Medicine

## 2024-05-29 ENCOUNTER — Ambulatory Visit
Admission: RE | Admit: 2024-05-29 | Discharge: 2024-05-29 | Disposition: A | Discharge status: Home / Self Care | Source: Ambulatory Visit | Attending: Family Medicine | Admitting: Family Medicine

## 2024-05-29 DIAGNOSIS — R17 Unspecified jaundice: Secondary | ICD-10-CM | POA: Diagnosis not present

## 2024-05-29 DIAGNOSIS — K824 Cholesterolosis of gallbladder: Secondary | ICD-10-CM | POA: Diagnosis not present

## 2024-05-29 DIAGNOSIS — R109 Unspecified abdominal pain: Secondary | ICD-10-CM | POA: Insufficient documentation

## 2024-05-30 ENCOUNTER — Encounter: Payer: Self-pay | Admitting: Family Medicine

## 2024-05-30 LAB — RETICULOCYTES
ABS Retic: 114000 {cells}/uL — ABNORMAL HIGH (ref 25000–90000)
Retic Ct Pct: 2.4 %

## 2024-05-30 LAB — BILIRUBIN, FRACTIONATED(TOT/DIR/INDIR)
Bilirubin, Direct: 0.3 mg/dL — ABNORMAL HIGH (ref 0.0–0.2)
Indirect Bilirubin: 1.6 mg/dL — ABNORMAL HIGH (ref 0.2–1.2)
Total Bilirubin: 1.9 mg/dL — ABNORMAL HIGH (ref 0.2–1.2)

## 2024-05-31 ENCOUNTER — Telehealth: Payer: Self-pay

## 2024-05-31 NOTE — Telephone Encounter (Signed)
 Copied from CRM #[id number]. Topic: Clinical - Lab/Test Results >> May 31, 2024 10:48 AM Berneda FALCON wrote: Reason for CRM: Pt would like Dr. KANDICE to know that he had an ultrasound done and he would like to know the results of the ultrasound.  Patient callback is [phone]

## 2024-05-31 NOTE — Telephone Encounter (Signed)
 Don't see results do you want me to call about it?

## 2024-05-31 NOTE — Telephone Encounter (Signed)
 Mychart message sent - still has not been read.

## 2024-06-01 ENCOUNTER — Telehealth: Payer: Self-pay | Admitting: Family Medicine

## 2024-06-01 NOTE — Telephone Encounter (Signed)
 Copied from CRM [id number]. Topic: Referral - Question >> Jun 01, 2024  1:39 PM Jayma L wrote: Reason for CRM: patient said his stomach pain has been going on for 3 weeks , and nothing is new or worse but wants to know if he can be referred to  gastro in [place] and wants to be seen by Dr. Legrand. Said if not that dr than he will go to anyone with sooner openings. Please callback patient and advise

## 2024-06-02 NOTE — Telephone Encounter (Signed)
 Replied via mychart.

## 2024-06-02 NOTE — Telephone Encounter (Unsigned)
 Copied from CRM #[id number]. Topic: Clinical - Lab/Test Results >> Jun 02, 2024  2:48 PM Martinique E wrote: Reason for CRM: Patient would like callback when his ultrasound results from 8/18 are in. Callback number [phone].

## 2024-06-03 ENCOUNTER — Ambulatory Visit: Payer: Self-pay | Admitting: Family Medicine

## 2024-06-03 DIAGNOSIS — R109 Unspecified abdominal pain: Secondary | ICD-10-CM

## 2024-06-03 DIAGNOSIS — R701 Abnormal plasma viscosity: Secondary | ICD-10-CM | POA: Insufficient documentation

## 2024-06-03 DIAGNOSIS — R17 Unspecified jaundice: Secondary | ICD-10-CM

## 2024-06-03 DIAGNOSIS — R1013 Epigastric pain: Secondary | ICD-10-CM

## 2024-06-06 NOTE — Telephone Encounter (Signed)
 Mychart message sent with results.  New GI referral to [facility] per pt request

## 2024-06-13 ENCOUNTER — Encounter: Payer: Self-pay | Admitting: Physician Assistant

## 2024-06-13 ENCOUNTER — Ambulatory Visit: Admitting: Physician Assistant

## 2024-06-13 VITALS — BP 116/84 | HR 88 | Ht 68.0 in | Wt 156.0 lb

## 2024-06-13 DIAGNOSIS — K828 Other specified diseases of gallbladder: Secondary | ICD-10-CM | POA: Diagnosis not present

## 2024-06-13 DIAGNOSIS — K824 Cholesterolosis of gallbladder: Secondary | ICD-10-CM

## 2024-06-13 DIAGNOSIS — R1011 Right upper quadrant pain: Secondary | ICD-10-CM | POA: Diagnosis not present

## 2024-06-13 DIAGNOSIS — Z8719 Personal history of other diseases of the digestive system: Secondary | ICD-10-CM | POA: Diagnosis not present

## 2024-06-13 NOTE — Progress Notes (Signed)
 06/13/2024 Christopher Pittman [id number] 1996/01/09  Referring provider: Rilla Baller, MD Primary GI doctor: Dr. San  ASSESSMENT AND PLAN:  RUQ pain for last 2 months, similar to pain 2022 when he had an EGD, intermittent, worse with fatty foods, no associations, mom with GB removal 05/16/2024 H. pylori breath test negative 05/29/2024 AB US  2 gallbladder polyps largest 0.5 cm -Will get HIDA for AB pain with history -Patient with gallbladder polyp  <=5 mm, will repeat right upper quadrant abdominal ultrasound every 6 months for 1 year if the polyp size remains stable, annually thereafter.   Hyperbilirubinemia  AST, ALT and alk phos unremarkable 05/29/2024 ABUS 2 gallbladder polyps largest 0.5 cm otherwise unremarkable 05/26/2024 direct bilirubin 0.3, indirect bilirubin 1.6 total bilirubin 1.9 - predominantly indirect hyperbilirubinemia,  likely Gilbert's No apparent liver disease by history, labs, or imaging.  No history of hemolysis or evidence for hemolysis on recent labs.  Reassurance provided. Reviewed    No treatment is necessary, though Gilbert's may be a risk factor for toxicity from some medications, particularly chemotherapy options.  History of dysphagia 10/01/2021 EGD shows normal duodenal bulb, normal stomach normal GE junction and esophagus, path showed negative H. pylori dysplasia No issues at this time Denies GERD  Screen colonoscopy  Due age 88, will call sooner if any change in bowel habits or blood in the stool   Patient Care Team: Rilla Baller, MD as PCP - General (Family Medicine)  HISTORY OF PRESENT ILLNESS: 28 y.o. male with a past medical history listed below presents as a new patient for evaluation of abdominal pain.   Previously seen by [facility] gastroenterology Dr. Unk 09/2021 for dysphagia.  Unremarkable EGD 09/2021.  Discussed the use of AI scribe software for clinical note transcription with the patient, who gave verbal consent to  proceed.  History of Present Illness   Christopher Pittman is a 28 year old male with Gilbert's syndrome who presents with recurrent right upper quadrant abdominal pain.  He has been experiencing recurrent right upper quadrant abdominal pain for the past two months. The pain is described as 'nagging' and is similar to discomfort he experienced in 2022. It is localized to the area of the gallbladder and does not radiate to the back or chest. The pain occurs intermittently, often triggered by the consumption of fatty or spicy foods, and lasts approximately 15 minutes before subsiding.  In 2022, he underwent an endoscopy which was reported as normal. He has not had a full abdominal ultrasound since then. His bilirubin levels have been elevated for years, and his physician told him it was due to something called Gilbert's syndrome. He has a family history of gallbladder issues, as his mother had her gallbladder removed.  He was previously prescribed Pepcid  for heartburn, which he took for two weeks but discontinued due to constipation. No current heartburn, reflux, difficulty swallowing, or changes in stool color or frequency. He has regular daily bowel movements without straining. No associated nausea, sweating, or changes in bowel habits.        He  reports that he has never smoked. He has never used smokeless tobacco. He reports current alcohol use. He reports that he does not use drugs.  RELEVANT GI HISTORY, IMAGING AND LABS: Results   LABS Bilirubin: Elevated, consistent with Gilbert's syndrome  RADIOLOGY Abdominal ultrasound: Gallbladder polyps, largest 0.5 cm      CBC    Component Value Date/Time   WBC 4.1 05/26/2024 1415   RBC 4.79 05/26/2024  1415   HGB 14.5 05/26/2024 1415   HCT 44.5 05/26/2024 1415   PLT 196 05/26/2024 1415   MCV 92.9 05/26/2024 1415   MCH 30.3 05/26/2024 1415   MCHC 32.6 05/26/2024 1415   RDW 11.2 05/26/2024 1415   LYMPHSABS 0.7 07/23/2021 1206   MONOABS 0.5  07/23/2021 1206   EOSABS 41 05/26/2024 1415   BASOSABS 0 05/26/2024 1415   Recent Labs    07/19/23 1517 02/22/24 1250 05/16/24 1012 05/26/24 1415  HGB 15.0 14.9 15.0 14.5    CMP     Component Value Date/Time   NA 137 05/16/2024 1012   K 3.9 05/16/2024 1012   CL 101 05/16/2024 1012   CO2 29 05/16/2024 1012   GLUCOSE 91 05/16/2024 1012   BUN 14 05/16/2024 1012   CREATININE 1.21 05/16/2024 1012   CALCIUM 9.2 05/16/2024 1012   PROT 7.2 05/16/2024 1012   ALBUMIN 4.4 05/16/2024 1012   AST 13 05/16/2024 1012   ALT 9 05/16/2024 1012   ALKPHOS 45 05/16/2024 1012   BILITOT 1.9 (H) 05/26/2024 0000   GFRNONAA >60 08/04/2021 1751      Latest Ref Rng & Units 05/26/2024   12:00 AM 05/16/2024   10:12 AM 02/22/2024   12:50 PM  Hepatic Function  Total Protein 6.0 - 8.3 g/dL  7.2  7.2   Albumin 3.5 - 5.2 g/dL  4.4  4.5   AST 0 - 37 U/L  13  16   ALT 0 - 53 U/L  9  10   Alk Phosphatase 39 - 117 U/L  45  56   Total Bilirubin 0.2 - 1.2 mg/dL 1.9  3.1  2.1   Bilirubin, Direct 0.0 - 0.2 mg/dL 0.3         Current Medications:     Current Outpatient Medications (Respiratory):    albuterol  (VENTOLIN  HFA) 108 (90 Base) MCG/ACT inhaler, Inhale 2 puffs into the lungs every 6 (six) hours as needed for wheezing or shortness of breath.   cetirizine  (ZYRTEC ) 10 MG tablet, Take 1 tablet (10 mg total) by mouth daily.   fluticasone  (FLONASE ) 50 MCG/ACT nasal spray, Place 2 sprays into both nostrils daily.   montelukast  (SINGULAIR ) 10 MG tablet, Take 1 tablet (10 mg total) by mouth at bedtime.  Current Outpatient Medications (Analgesics):    acetaminophen  (TYLENOL ) 500 MG tablet, Take 1 tablet (500 mg total) by mouth every 12 (twelve) hours. (Patient taking differently: Take 500 mg by mouth every 12 (twelve) hours. As needed)   Current Outpatient Medications (Other):    Cholecalciferol  (VITAMIN D3) 50 MCG (2000 UT) CAPS, Take 1 capsule (2,000 Units total) by mouth daily.   famotidine  (PEPCID )  20 MG tablet, Take 1 tablet (20 mg total) by mouth 2 (two) times daily.   Olopatadine  HCl 0.2 % SOLN, Apply 1 drop to eye daily as needed (itchy watery eyes).  Medical History:  Past Medical History:  Diagnosis Date   Allergy     Bladder polyps 2025   gallbladder   Costochondritis 08/14/2021   COVID-19 virus infection 05/12/2022   Exercise-induced asthma    in childhood   Open fracture of left tibia and fibula, type I or II, initial encounter 07/14/2021   Seasonal allergies    Tympanic membrane perforation, left 03/26/2020   Unintentional weight loss 08/14/2021   Vitamin D  insufficiency 07/15/2021   Allergies: No Known Allergies   Surgical History:  He  has a past surgical history that includes Myringotomy; Adenoidectomy; Tympanoplasty (Left,  04/08/2020); I & D extremity (Left, 07/14/2021); Tibia IM nail insertion (Left, 07/14/2021); and Esophagogastroduodenoscopy (egd) with propofol  (N/A, 10/01/2021). Family History:  His family history includes Asthma in his brother.  REVIEW OF SYSTEMS  : All other systems reviewed and negative except where noted in the History of Present Illness.  PHYSICAL EXAM: BP 116/84   Pulse 88   Ht 5' 8 (1.727 m)   Wt 156 lb (70.8 kg)   BMI 23.72 kg/m  Physical Exam   GENERAL APPEARANCE: Well nourished, in no apparent distress. HEENT: No cervical lymphadenopathy, unremarkable thyroid , sclerae anicteric, conjunctiva pink. RESPIRATORY: Respiratory effort normal, breath sounds equal bilaterally without rales, rhonchi, or wheezing. CARDIO: Regular rate and rhythm with no murmurs, rubs, or gallops, peripheral pulses intact. ABDOMEN: Soft, non-distended, active bowel sounds in all four quadrants, non-tender to palpation, no rebound, no mass appreciated. RECTAL: Declines. MUSCULOSKELETAL: Full range of motion, normal gait, without edema. SKIN: Dry, intact without rashes or lesions. No jaundice. NEURO: Alert, oriented, no focal deficits. PSYCH:  Cooperative, normal mood and affect.      Alan JONELLE Coombs, PA-C 11:43 AM

## 2024-06-13 NOTE — Patient Instructions (Signed)
 You have been scheduled for a HIDA scan at [facility] (1st floor) on 06/21/24. Please arrive 15 minutes prior to your scheduled appointment at  8:00 am. Make certain not to have anything to eat or drink at least 6 hours prior to your test. Should this appointment date or time not work well for you, please call radiology scheduling at [phone].  _____________________________________________________________________ hepatobiliary (HIDA) scan is an imaging procedure used to diagnose problems in the liver, gallbladder and bile ducts. In the HIDA scan, a radioactive chemical or tracer is injected into a vein in your arm. The tracer is handled by the liver like bile. Bile is a fluid produced and excreted by your liver that helps your digestive system break down fats in the foods you eat. Bile is stored in your gallbladder and the gallbladder releases the bile when you eat a meal. A special nuclear medicine scanner (gamma camera) tracks the flow of the tracer from your liver into your gallbladder and small intestine.  During your HIDA scan  You'll be asked to change into a hospital gown before your HIDA scan begins. Your health care team will position you on a table, usually on your back. The radioactive tracer is then injected into a vein in your arm.The tracer travels through your bloodstream to your liver, where it's taken up by the bile-producing cells. The radioactive tracer travels with the bile from your liver into your gallbladder and through your bile ducts to your small intestine.You may feel some pressure while the radioactive tracer is injected into your vein. As you lie on the table, a special gamma camera is positioned over your abdomen taking pictures of the tracer as it moves through your body. The gamma camera takes pictures continually for about an hour. You'll need to keep still during the HIDA scan. This can become uncomfortable, but you may find that you can lessen the discomfort by  taking deep breaths and thinking about other things. Tell your health care team if you're uncomfortable. The radiologist will watch on a computer the progress of the radioactive tracer through your body. The HIDA scan may be stopped when the radioactive tracer is seen in the gallbladder and enters your small intestine. This typically takes about an hour. In some cases extra imaging will be performed if original images aren't satisfactory, if morphine  is given to help visualize the gallbladder or if the medication CCK is given to look at the contraction of the gallbladder. This test typically takes 2 hours to complete. ________________________________________________________________________  In 6 months we will schedule you for an abdominal ultrasound.  Thank you for trusting me with your gastrointestinal care!   Alan Coombs, PA-C  _______________________________________________________  If your blood pressure at your visit was 140/90 or greater, please contact your primary care physician to follow up on this.  _______________________________________________________  If you are age 5 or older, your body mass index should be between 23-30. Your Body mass index is 23.72 kg/m. If this is out of the aforementioned range listed, please consider follow up with your Primary Care Provider.  If you are age 61 or younger, your body mass index should be between 19-25. Your Body mass index is 23.72 kg/m. If this is out of the aformentioned range listed, please consider follow up with your Primary Care Provider.   ________________________________________________________  The [facility] providers would like to encourage you to use MYCHART to communicate with providers for non-urgent requests or questions.  Due to long hold  times on the telephone, sending your provider a message by [facility] may be a faster and more efficient way to get a response.  Please allow 48 business hours for a response.  Please  remember that this is for non-urgent requests.  _______________________________________________________  [facility] is using a team-based approach to care.  Your team is made up of your doctor and two to three APPS. Our APPS (Nurse Practitioners and Physician Assistants) work with your physician to ensure care continuity for you. They are fully qualified to address your health concerns and develop a treatment plan. They communicate directly with your gastroenterologist to care for you. Seeing the Advanced Practice Practitioners on your physician's team can help you by facilitating care more promptly, often allowing for earlier appointments, access to diagnostic testing, procedures, and other specialty referrals.

## 2024-06-21 ENCOUNTER — Encounter (HOSPITAL_COMMUNITY)
Admission: RE | Admit: 2024-06-21 | Discharge: 2024-06-21 | Disposition: A | Discharge status: Home / Self Care | Source: Ambulatory Visit | Attending: Physician Assistant | Admitting: Physician Assistant

## 2024-06-21 DIAGNOSIS — R1011 Right upper quadrant pain: Secondary | ICD-10-CM | POA: Diagnosis not present

## 2024-06-21 MED ORDER — TECHNETIUM TC 99M MEBROFENIN IV KIT
5.1900 | PACK | Freq: Once | INTRAVENOUS | Status: AC | PRN
  Administered 2024-06-21: 5.19 via INTRAVENOUS

## 2024-06-22 ENCOUNTER — Ambulatory Visit: Payer: Self-pay | Admitting: Physician Assistant

## 2024-06-22 NOTE — Progress Notes (Signed)
 Agree with the assessment and plan as outlined by Quentin Mulling, PA-C. ? ?Keron Neenan, DO, FACG ? ?

## 2024-07-11 ENCOUNTER — Encounter: Payer: Self-pay | Admitting: *Deleted

## 2024-07-24 DIAGNOSIS — K824 Cholesterolosis of gallbladder: Secondary | ICD-10-CM | POA: Diagnosis not present

## 2024-07-24 DIAGNOSIS — R1011 Right upper quadrant pain: Secondary | ICD-10-CM | POA: Diagnosis not present

## 2024-08-09 NOTE — Telephone Encounter (Signed)
 Pt seen by [facility] 07/24/24, Dr Unk  Referral updated.

## 2024-11-17 ENCOUNTER — Ambulatory Visit: Admitting: Family Medicine

## 2024-11-17 ENCOUNTER — Telehealth: Payer: Self-pay | Admitting: *Deleted

## 2024-11-17 ENCOUNTER — Encounter: Payer: Self-pay | Admitting: Family Medicine

## 2024-11-17 VITALS — BP 112/78 | HR 67 | Temp 97.9°F | Ht 68.0 in | Wt 155.8 lb

## 2024-11-17 DIAGNOSIS — M79605 Pain in left leg: Secondary | ICD-10-CM | POA: Insufficient documentation

## 2024-11-17 DIAGNOSIS — J302 Other seasonal allergic rhinitis: Secondary | ICD-10-CM

## 2024-11-17 DIAGNOSIS — Z23 Encounter for immunization: Secondary | ICD-10-CM

## 2024-11-17 DIAGNOSIS — D72819 Decreased white blood cell count, unspecified: Secondary | ICD-10-CM

## 2024-11-17 DIAGNOSIS — E7800 Pure hypercholesterolemia, unspecified: Secondary | ICD-10-CM

## 2024-11-17 DIAGNOSIS — E559 Vitamin D deficiency, unspecified: Secondary | ICD-10-CM

## 2024-11-17 DIAGNOSIS — Z0000 Encounter for general adult medical examination without abnormal findings: Secondary | ICD-10-CM

## 2024-11-17 LAB — CBC WITH DIFFERENTIAL/PLATELET
Basophils Absolute: 0 10*3/uL (ref 0.0–0.1)
Basophils Relative: 0.2 % (ref 0.0–3.0)
Eosinophils Absolute: 0 10*3/uL (ref 0.0–0.7)
Eosinophils Relative: 0.8 % (ref 0.0–5.0)
HCT: 43.5 % (ref 39.0–52.0)
Hemoglobin: 14.7 g/dL (ref 13.0–17.0)
Lymphocytes Relative: 40.8 % (ref 12.0–46.0)
Lymphs Abs: 1.3 10*3/uL (ref 0.7–4.0)
MCHC: 33.7 g/dL (ref 30.0–36.0)
MCV: 89.1 fl (ref 78.0–100.0)
Monocytes Absolute: 0.3 10*3/uL (ref 0.1–1.0)
Monocytes Relative: 10.5 % (ref 3.0–12.0)
Neutro Abs: 1.5 10*3/uL (ref 1.4–7.7)
Neutrophils Relative %: 47.7 % (ref 43.0–77.0)
Platelets: 188 10*3/uL (ref 150.0–400.0)
RBC: 4.89 Mil/uL (ref 4.22–5.81)
RDW: 12.1 % (ref 11.5–15.5)
WBC: 3.2 10*3/uL — ABNORMAL LOW (ref 4.0–10.5)

## 2024-11-17 LAB — COMPREHENSIVE METABOLIC PANEL WITH GFR
ALT: 10 U/L (ref 3–53)
AST: 13 U/L (ref 5–37)
Albumin: 4.3 g/dL (ref 3.5–5.2)
Alkaline Phosphatase: 47 U/L (ref 39–117)
BUN: 13 mg/dL (ref 6–23)
CO2: 30 meq/L (ref 19–32)
Calcium: 9.1 mg/dL (ref 8.4–10.5)
Chloride: 105 meq/L (ref 96–112)
Creatinine, Ser: 1.21 mg/dL (ref 0.40–1.50)
GFR: 82.79 mL/min
Glucose, Bld: 94 mg/dL (ref 70–99)
Potassium: 4.4 meq/L (ref 3.5–5.1)
Sodium: 141 meq/L (ref 135–145)
Total Bilirubin: 1.8 mg/dL — ABNORMAL HIGH (ref 0.2–1.2)
Total Protein: 6.9 g/dL (ref 6.0–8.3)

## 2024-11-17 LAB — LIPID PANEL
Cholesterol: 278 mg/dL — ABNORMAL HIGH (ref 28–200)
HDL: 76 mg/dL
LDL Cholesterol: 190 mg/dL — ABNORMAL HIGH (ref 10–99)
NonHDL: 201.63
Total CHOL/HDL Ratio: 4
Triglycerides: 56 mg/dL (ref 10.0–149.0)
VLDL: 11.2 mg/dL (ref 0.0–40.0)

## 2024-11-17 LAB — VITAMIN D 25 HYDROXY (VIT D DEFICIENCY, FRACTURES): VITD: 19 ng/mL — ABNORMAL LOW (ref 30.00–100.00)

## 2024-11-17 NOTE — Assessment & Plan Note (Addendum)
 Continues PRN albuterol  inhaler, zyrtec , singulair , flonase , olopatadine .

## 2024-11-17 NOTE — Assessment & Plan Note (Signed)
 New, in h/o remote trauma and hardware in place (2022) Recent prolonged period of immobility.  Will check D Dimer r/o DVT.

## 2024-11-17 NOTE — Progress Notes (Signed)
 " Ph: [phone] Fax: [phone]   Patient ID: Christopher Pittman, male    DOB: 1996-04-11, 29 y.o.   MRN: [id number]  This visit was conducted in person.  BP 112/78 (BP Location: Left Arm, Patient Position: Sitting, Cuff Size: Normal)   Pulse 67   Temp 97.9 F (36.6 C) (Oral)   Ht 5' 8 (1.727 m)   Wt 155 lb 12.8 oz (70.7 kg)   SpO2 97%   BMI 23.69 kg/m    CC: CPE - has new insurance Subjective:   HPI: Christopher Pittman is a 29 y.o. male presenting on 11/17/2024 for Acute Visit (Pt see's ortho but they stated for him to visit PCP to rule out bloood clot in left leg //Would like flu shot)   Last physical was 02/2024.  Just found out wife's pregnant -due date 04/2025.   Functional RUQ abd pain - saw GI, rec omeprazole  20mg  daily PRN, monitor dietary habits and triggers, small gallbladder polyps planned monitoring for this. No longer on omeprazole  - now takes pepcid  about once a week.   Takes zyrtec  and singulair  regularly, flonase  and albuterol  rescue inh as needed.  Energy levels are good.   H/o left leg fracture 07/2021 at work. Having some left anterior leg discomfort into ankle without redness, warmth, swelling. Discomfort worse with walking, started 5d ago. Recently drove back from [place] (cruise) - 12 hour car drive.   Preventative: Flu shot today  COVID vaccine - Pfizer 02/2020, 03/2020, booster 10/2020 Tdap 07/2021 Seat belt use discussed Sunscreen use discussed. No changing moles on skin.  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 Interim medical history since our last visit reviewed. Allergies and medications  reviewed and updated. Outpatient Medications Prior to Visit  Medication Sig Dispense Refill   acetaminophen  (TYLENOL ) 500 MG tablet Take 1 tablet (500 mg total) by mouth every 12 (twelve) hours. 60 tablet 0   albuterol  (VENTOLIN  HFA) 108 (90 Base) MCG/ACT inhaler Inhale 2 puffs into the lungs every 6 (six) hours as needed for wheezing or shortness of breath. 8 g 1   cetirizine  (ZYRTEC ) 10 MG tablet Take 1 tablet (10 mg total) by mouth daily. (Patient taking differently: Take 10 mg by mouth as needed.) 30 tablet 5   famotidine  (PEPCID ) 20 MG tablet Take 1 tablet (20 mg total) by mouth 2 (two) times daily. (Patient taking differently: Take 20 mg by mouth as needed.) 60 tablet 0   fluticasone  (FLONASE ) 50 MCG/ACT nasal spray Place 2 sprays into both nostrils daily. (Patient taking differently: Place 2 sprays into both nostrils as needed.) 16 g 5   montelukast  (SINGULAIR ) 10 MG tablet Take 1 tablet (10 mg total) by mouth at bedtime. 90 tablet 4   Olopatadine  HCl 0.2 % SOLN Apply 1 drop to eye daily as needed (itchy watery eyes). 2.5 mL 5   Cholecalciferol  (VITAMIN D3) 50 MCG (2000 UT) CAPS Take 1 capsule (2,000 Units total) by mouth daily. (Patient not taking: Reported on 11/17/2024)     No facility-administered medications prior to visit.     Per HPI unless specifically indicated in ROS  section below Review of Systems  Constitutional:  Negative for activity change, appetite change, chills, fatigue, fever and unexpected weight change.  HENT:  Negative for hearing loss.   Eyes:  Negative for visual disturbance.  Respiratory:  Negative for cough, chest tightness, shortness of breath and wheezing.   Cardiovascular:  Negative for chest pain, palpitations and leg swelling.  Gastrointestinal:  Negative for abdominal distention, abdominal pain, blood in stool, constipation, diarrhea, nausea and vomiting.  Genitourinary:  Negative for difficulty urinating and hematuria.  Musculoskeletal:  Negative for  arthralgias, myalgias and neck pain.  Skin:  Negative for rash.  Neurological:  Positive for headaches (slight the other day). Negative for dizziness, seizures and syncope.  Hematological:  Negative for adenopathy. Does not bruise/bleed easily.  Psychiatric/Behavioral:  Negative for dysphoric mood. The patient is not nervous/anxious.     Objective:  BP 112/78 (BP Location: Left Arm, Patient Position: Sitting, Cuff Size: Normal)   Pulse 67   Temp 97.9 F (36.6 C) (Oral)   Ht 5' 8 (1.727 m)   Wt 155 lb 12.8 oz (70.7 kg)   SpO2 97%   BMI 23.69 kg/m   Wt Readings from Last 3 Encounters:  11/17/24 155 lb 12.8 oz (70.7 kg)  06/13/24 156 lb (70.8 kg)  05/26/24 160 lb 4 oz (72.7 kg)      Physical Exam Vitals and nursing note reviewed.  Constitutional:      General: He is not in acute distress.    Appearance: Normal appearance. He is well-developed. He is not ill-appearing.  HENT:     Head: Normocephalic and atraumatic.     Right Ear: Hearing, tympanic membrane, ear canal and external ear normal.     Left Ear: Hearing, tympanic membrane, ear canal and external ear normal.     Mouth/Throat:     Mouth: Mucous membranes are moist.     Pharynx: Oropharynx is clear. No oropharyngeal exudate or posterior oropharyngeal erythema.  Eyes:     General: No scleral icterus.    Extraocular Movements: Extraocular movements intact.     Conjunctiva/sclera: Conjunctivae normal.     Pupils: Pupils are equal, round, and reactive to light.  Neck:     Thyroid : No thyroid  mass or thyromegaly.  Cardiovascular:     Rate and Rhythm: Normal rate and regular rhythm.     Pulses: Normal pulses.          Radial pulses are 2+ on the right side and 2+ on the left side.     Heart sounds: Normal heart sounds. No murmur heard. Pulmonary:     Effort: Pulmonary effort is normal. No respiratory distress.     Breath sounds: Normal breath sounds. No wheezing, rhonchi or rales.  Abdominal:     General: Bowel sounds  are normal. There is no distension.     Palpations: Abdomen is soft. There is no mass.     Tenderness: There is no abdominal tenderness. There is no guarding or rebound.     Hernia: No hernia is present.  Musculoskeletal:        General: Normal range of motion.     Cervical back: Normal range of motion and neck supple.     Right lower leg: No edema.     Left lower leg: No edema.     Comments:  L calf circ 36cm R calf circ 35cm  No palpable cords No popliteal fullness  2+ DP bilaterally  Lymphadenopathy:     Cervical: No cervical  adenopathy.  Skin:    General: Skin is warm and dry.     Findings: No rash.  Neurological:     General: No focal deficit present.     Mental Status: He is alert and oriented to person, place, and time.  Psychiatric:        Mood and Affect: Mood normal.        Behavior: Behavior normal.        Thought Content: Thought content normal.        Judgment: Judgment normal.       Results for orders placed or performed in visit on 05/26/24  Bilirubin, fractionated(tot/dir/indir)   Collection Time: 05/26/24 12:00 AM  Result Value Ref Range   Total Bilirubin 1.9 (H) 0.2 - 1.2 mg/dL   Bilirubin, Direct 0.3 (H) 0.0 - 0.2 mg/dL   Indirect Bilirubin 1.6 (H) 0.2 - 1.2 mg/dL (calc)  Reticulocytes   Collection Time: 05/26/24 12:00 AM  Result Value Ref Range   Retic Ct Pct 2.4 %   ABS Retic 114,000 (H) 25,000 - 90,000 cells/uL  Urinalysis, Routine w reflex microscopic   Collection Time: 05/26/24  1:18 PM  Result Value Ref Range   Color, Urine YELLOW Yellow;Lt. Yellow;Straw;Dark Yellow;Amber;Green;Red;Brown   APPearance SL CLOUDY (A) Clear;Turbid;Slightly Cloudy;Cloudy   Specific Gravity, Urine 1.020 1.000 - 1.030   pH 8.0 5.0 - 8.0   Total Protein, Urine NEGATIVE Negative   Urine Glucose NEGATIVE Negative   Ketones, ur NEGATIVE Negative   Bilirubin Urine NEGATIVE Negative   Hgb urine dipstick NEGATIVE Negative   Urobilinogen, UA 0.2 0.0 - 1.0    Leukocytes,Ua NEGATIVE Negative   Nitrite NEGATIVE Negative   WBC, UA none seen 0-2/hpf   RBC / HPF none seen 0-2/hpf   Mucus, UA Presence of (A) None   Amorphous Present (A) None;Present  CBC with Differential   Collection Time: 05/26/24  2:15 PM  Result Value Ref Range   WBC 4.1 3.8 - 10.8 Thousand/uL   RBC 4.79 4.20 - 5.80 Million/uL   Hemoglobin 14.5 13.2 - 17.1 g/dL   HCT 55.4 61.4 - 49.9 %   MCV 92.9 80.0 - 100.0 fL   MCH 30.3 27.0 - 33.0 pg   MCHC 32.6 32.0 - 36.0 g/dL   RDW 88.7 88.9 - 84.9 %   Platelets 196 140 - 400 Thousand/uL   MPV 10.6 7.5 - 12.5 fL   Neutro Abs 2,259 1,500 - 7,800 cells/uL   Absolute Lymphocytes 1,369 850 - 3,900 cells/uL   Absolute Monocytes 431 200 - 950 cells/uL   Eosinophils Absolute 41 15 - 500 cells/uL   Basophils Absolute 0 0 - 200 cells/uL   Neutrophils Relative % 55.1 %   Total Lymphocyte 33.4 %   Monocytes Relative 10.5 %   Eosinophils Relative 1.0 %   Basophils Relative 0.0 %   Lab Results  Component Value Date   TSH 1.08 12/24/2021    Assessment & Plan:   Problem List Items Addressed This Visit     Health maintenance examination - Primary (Chronic)   Preventative protocols reviewed and updated unless pt declined. Discussed healthy diet and lifestyle.       Vitamin D  insufficiency   Update levels off replacement.      Relevant Orders   VITAMIN D  25 Hydroxy (Vit-D Deficiency, Fractures)   Seasonal allergic rhinitis   Continues PRN albuterol  inhaler, zyrtec , singulair , flonase , olopatadine .      Pure hypercholesterolemia   Update FLP off medication.  The ASCVD Risk score (Arnett DK, et al., 2019) failed to calculate for the following reasons:   The 2019 ASCVD risk score is only valid for ages 66 to 26   * - Cholesterol units were assumed       Relevant Orders   Lipid panel   Comprehensive metabolic panel with GFR   Leukopenia   Latest levels normal Update CBC       Relevant Orders   CBC with  Differential/Platelet   Left leg pain   New, in h/o remote trauma and hardware in place (2022) Recent prolonged period of immobility.  Will check D Dimer r/o DVT.       Relevant Orders   D-dimer, quantitative   Other Visit Diagnoses       Encounter for immunization       Relevant Orders   Flu vaccine trivalent PF, 6mos and older(Flulaval,Afluria,Fluarix,Fluzone) (Completed)        No orders of the defined types were placed in this encounter.   Orders Placed This Encounter  Procedures   Flu vaccine trivalent PF, 6mos and older(Flulaval,Afluria,Fluarix,Fluzone)   Lipid panel   Comprehensive metabolic panel with GFR   CBC with Differential/Platelet   VITAMIN D  25 Hydroxy (Vit-D Deficiency, Fractures)   D-dimer, quantitative    Patient Instructions  Flu shot today  Labs today  Elevate legs.  Good to see you today Return as needed or in 1 year for next physical   Follow up plan: Return in about 1 year (around 11/17/2025) for annual exam, prior fasting for blood work.  Anton Blas, MD   "

## 2024-11-17 NOTE — Telephone Encounter (Signed)
 Copied from CRM #[id number]. Topic: Clinical - Lab/Test Results >> Nov 17, 2024  4:46 PM Delon DASEN wrote: Reason for CRM: calling for results

## 2024-11-17 NOTE — Patient Instructions (Addendum)
 Flu shot today  Labs today  Elevate legs.  Good to see you today Return as needed or in 1 year for next physical

## 2024-11-17 NOTE — Assessment & Plan Note (Signed)
 Latest levels normal Update CBC

## 2024-11-17 NOTE — Assessment & Plan Note (Signed)
Update levels off replacement. 

## 2024-11-17 NOTE — Assessment & Plan Note (Signed)
 Update FLP off medication. The ASCVD Risk score (Arnett DK, et al., 2019) failed to calculate for the following reasons:   The 2019 ASCVD risk score is only valid for ages 70 to 41   * - Cholesterol units were assumed

## 2024-11-17 NOTE — Assessment & Plan Note (Signed)
 Preventative protocols reviewed and updated unless pt declined. Discussed healthy diet and lifestyle.

## 2024-11-21 ENCOUNTER — Ambulatory Visit: Admitting: Family Medicine

## 2025-02-23 ENCOUNTER — Encounter: Payer: Self-pay | Admitting: Family Medicine
